# Patient Record
Sex: Male | Born: 1940 | Race: Black or African American | Hispanic: No | Marital: Married | Smoking: Former smoker
Health system: Southern US, Community
[De-identification: ages and names within clinical notes are randomized; demographics above are authoritative.]

## PROBLEM LIST (undated history)

## (undated) DIAGNOSIS — G4733 Obstructive sleep apnea (adult) (pediatric): Secondary | ICD-10-CM

## (undated) DIAGNOSIS — E7800 Pure hypercholesterolemia, unspecified: Secondary | ICD-10-CM

## (undated) DIAGNOSIS — I10 Essential (primary) hypertension: Secondary | ICD-10-CM

## (undated) DIAGNOSIS — I2510 Atherosclerotic heart disease of native coronary artery without angina pectoris: Secondary | ICD-10-CM

## (undated) DIAGNOSIS — K219 Gastro-esophageal reflux disease without esophagitis: Secondary | ICD-10-CM

## (undated) DIAGNOSIS — I4891 Unspecified atrial fibrillation: Secondary | ICD-10-CM

## (undated) DIAGNOSIS — J449 Chronic obstructive pulmonary disease, unspecified: Secondary | ICD-10-CM

## (undated) DIAGNOSIS — E119 Type 2 diabetes mellitus without complications: Secondary | ICD-10-CM

## (undated) DIAGNOSIS — M1990 Unspecified osteoarthritis, unspecified site: Secondary | ICD-10-CM

## (undated) HISTORY — DX: Chronic obstructive pulmonary disease, unspecified: J44.9

## (undated) HISTORY — DX: Type 2 diabetes mellitus without complications: E11.9

## (undated) HISTORY — PX: COLON RESECTION: SHX5231

## (undated) HISTORY — DX: Pure hypercholesterolemia, unspecified: E78.00

## (undated) HISTORY — DX: Unspecified atrial fibrillation: I48.91

## (undated) HISTORY — DX: Unspecified osteoarthritis, unspecified site: M19.90

## (undated) HISTORY — DX: Essential (primary) hypertension: I10

## (undated) HISTORY — DX: Obstructive sleep apnea (adult) (pediatric): G47.33

## (undated) HISTORY — DX: Atherosclerotic heart disease of native coronary artery without angina pectoris: I25.10

## (undated) HISTORY — PX: APPENDECTOMY: SHX54

## (undated) HISTORY — PX: ABDOMINAL SURGERY: SHX537

## (undated) HISTORY — DX: Gastro-esophageal reflux disease without esophagitis: K21.9

## (undated) HISTORY — PX: OTHER SURGICAL HISTORY: SHX169

---

## 2001-01-08 ENCOUNTER — Ambulatory Visit (HOSPITAL_COMMUNITY): Admission: RE | Admit: 2001-01-08 | Discharge: 2001-01-08 | Payer: Self-pay | Admitting: Pulmonary Disease

## 2001-01-17 ENCOUNTER — Encounter: Payer: Self-pay | Admitting: Emergency Medicine

## 2001-01-17 ENCOUNTER — Emergency Department (HOSPITAL_COMMUNITY): Admission: EM | Admit: 2001-01-17 | Discharge: 2001-01-17 | Payer: Self-pay | Admitting: Emergency Medicine

## 2001-04-17 ENCOUNTER — Ambulatory Visit (HOSPITAL_COMMUNITY): Admission: RE | Admit: 2001-04-17 | Discharge: 2001-04-17 | Payer: Self-pay | Admitting: Family Medicine

## 2001-04-17 ENCOUNTER — Encounter: Payer: Self-pay | Admitting: Family Medicine

## 2002-12-31 ENCOUNTER — Emergency Department (HOSPITAL_COMMUNITY): Admission: EM | Admit: 2002-12-31 | Discharge: 2002-12-31 | Payer: Self-pay | Admitting: *Deleted

## 2002-12-31 ENCOUNTER — Encounter: Payer: Self-pay | Admitting: Internal Medicine

## 2002-12-31 ENCOUNTER — Inpatient Hospital Stay (HOSPITAL_COMMUNITY): Admission: EM | Admit: 2002-12-31 | Discharge: 2003-01-03 | Payer: Self-pay | Admitting: Cardiology

## 2002-12-31 ENCOUNTER — Encounter: Payer: Self-pay | Admitting: *Deleted

## 2003-01-02 ENCOUNTER — Encounter: Payer: Self-pay | Admitting: *Deleted

## 2003-07-25 ENCOUNTER — Ambulatory Visit (HOSPITAL_COMMUNITY): Admission: RE | Admit: 2003-07-25 | Discharge: 2003-07-25 | Payer: Self-pay | Admitting: Gastroenterology

## 2004-04-23 ENCOUNTER — Ambulatory Visit: Payer: Self-pay | Admitting: *Deleted

## 2004-05-02 ENCOUNTER — Ambulatory Visit: Payer: Self-pay | Admitting: Critical Care Medicine

## 2004-05-14 ENCOUNTER — Encounter: Payer: Self-pay | Admitting: Orthopedic Surgery

## 2004-06-12 ENCOUNTER — Ambulatory Visit: Payer: Self-pay | Admitting: Internal Medicine

## 2004-06-27 ENCOUNTER — Ambulatory Visit: Payer: Self-pay | Admitting: *Deleted

## 2004-07-03 ENCOUNTER — Ambulatory Visit: Payer: Self-pay | Admitting: Critical Care Medicine

## 2004-07-11 ENCOUNTER — Ambulatory Visit: Payer: Self-pay | Admitting: Cardiology

## 2004-08-01 ENCOUNTER — Ambulatory Visit: Payer: Self-pay | Admitting: Cardiology

## 2004-08-25 ENCOUNTER — Emergency Department (HOSPITAL_COMMUNITY): Admission: EM | Admit: 2004-08-25 | Discharge: 2004-08-26 | Payer: Self-pay | Admitting: Emergency Medicine

## 2004-09-03 ENCOUNTER — Ambulatory Visit: Payer: Self-pay | Admitting: Cardiology

## 2004-10-19 ENCOUNTER — Ambulatory Visit: Payer: Self-pay | Admitting: Cardiology

## 2004-10-19 ENCOUNTER — Ambulatory Visit: Payer: Self-pay | Admitting: Critical Care Medicine

## 2004-11-16 ENCOUNTER — Ambulatory Visit: Payer: Self-pay | Admitting: Cardiology

## 2004-12-06 ENCOUNTER — Ambulatory Visit: Payer: Self-pay | Admitting: Internal Medicine

## 2004-12-06 ENCOUNTER — Ambulatory Visit: Payer: Self-pay | Admitting: *Deleted

## 2004-12-20 ENCOUNTER — Ambulatory Visit: Payer: Self-pay | Admitting: Critical Care Medicine

## 2005-01-16 ENCOUNTER — Ambulatory Visit: Payer: Self-pay | Admitting: *Deleted

## 2005-02-12 ENCOUNTER — Ambulatory Visit (HOSPITAL_COMMUNITY): Admission: RE | Admit: 2005-02-12 | Discharge: 2005-02-12 | Payer: Self-pay | Admitting: Nephrology

## 2005-02-14 ENCOUNTER — Ambulatory Visit: Payer: Self-pay | Admitting: *Deleted

## 2005-03-14 ENCOUNTER — Ambulatory Visit: Payer: Self-pay | Admitting: *Deleted

## 2005-04-11 ENCOUNTER — Ambulatory Visit: Payer: Self-pay | Admitting: *Deleted

## 2005-04-12 ENCOUNTER — Ambulatory Visit: Payer: Self-pay | Admitting: Critical Care Medicine

## 2005-05-13 ENCOUNTER — Ambulatory Visit: Payer: Self-pay | Admitting: Orthopedic Surgery

## 2005-06-17 ENCOUNTER — Ambulatory Visit: Payer: Self-pay | Admitting: Critical Care Medicine

## 2005-06-19 ENCOUNTER — Ambulatory Visit: Payer: Self-pay | Admitting: *Deleted

## 2005-08-12 ENCOUNTER — Ambulatory Visit: Payer: Self-pay | Admitting: Cardiology

## 2005-09-17 ENCOUNTER — Ambulatory Visit: Payer: Self-pay | Admitting: *Deleted

## 2005-09-18 ENCOUNTER — Ambulatory Visit: Payer: Self-pay | Admitting: Critical Care Medicine

## 2005-10-18 ENCOUNTER — Ambulatory Visit: Payer: Self-pay | Admitting: *Deleted

## 2005-11-21 ENCOUNTER — Ambulatory Visit: Payer: Self-pay | Admitting: *Deleted

## 2005-12-20 ENCOUNTER — Ambulatory Visit: Payer: Self-pay | Admitting: Cardiology

## 2005-12-23 ENCOUNTER — Ambulatory Visit: Payer: Self-pay | Admitting: Cardiology

## 2005-12-30 ENCOUNTER — Ambulatory Visit: Payer: Self-pay | Admitting: Cardiology

## 2006-02-04 ENCOUNTER — Ambulatory Visit: Payer: Self-pay | Admitting: *Deleted

## 2006-02-06 ENCOUNTER — Ambulatory Visit: Payer: Self-pay | Admitting: Critical Care Medicine

## 2006-02-26 ENCOUNTER — Ambulatory Visit: Payer: Self-pay | Admitting: Critical Care Medicine

## 2006-03-11 ENCOUNTER — Ambulatory Visit: Payer: Self-pay | Admitting: Cardiology

## 2006-03-31 ENCOUNTER — Ambulatory Visit: Payer: Self-pay | Admitting: Orthopedic Surgery

## 2006-04-14 ENCOUNTER — Ambulatory Visit: Payer: Self-pay | Admitting: Orthopedic Surgery

## 2006-04-15 ENCOUNTER — Ambulatory Visit: Payer: Self-pay | Admitting: Cardiology

## 2006-05-06 ENCOUNTER — Ambulatory Visit: Payer: Self-pay | Admitting: Cardiovascular Disease

## 2006-05-08 ENCOUNTER — Ambulatory Visit: Payer: Self-pay | Admitting: Critical Care Medicine

## 2006-05-18 ENCOUNTER — Ambulatory Visit: Payer: Self-pay | Admitting: Cardiology

## 2006-05-18 ENCOUNTER — Inpatient Hospital Stay (HOSPITAL_COMMUNITY): Admission: EM | Admit: 2006-05-18 | Discharge: 2006-05-19 | Payer: Self-pay | Admitting: Emergency Medicine

## 2006-05-26 ENCOUNTER — Ambulatory Visit: Payer: Self-pay | Admitting: Cardiology

## 2006-05-26 ENCOUNTER — Encounter (HOSPITAL_COMMUNITY): Admission: RE | Admit: 2006-05-26 | Discharge: 2006-06-09 | Payer: Self-pay | Admitting: Cardiology

## 2006-05-26 ENCOUNTER — Ambulatory Visit: Payer: Self-pay | Admitting: Orthopedic Surgery

## 2006-05-26 ENCOUNTER — Ambulatory Visit: Payer: Self-pay | Admitting: Internal Medicine

## 2006-06-05 ENCOUNTER — Ambulatory Visit: Payer: Self-pay | Admitting: Cardiology

## 2006-06-09 ENCOUNTER — Ambulatory Visit: Payer: Self-pay | Admitting: Internal Medicine

## 2006-07-10 ENCOUNTER — Ambulatory Visit: Payer: Self-pay | Admitting: Cardiology

## 2006-07-17 ENCOUNTER — Ambulatory Visit: Payer: Self-pay | Admitting: Internal Medicine

## 2006-07-21 ENCOUNTER — Ambulatory Visit: Payer: Self-pay | Admitting: Critical Care Medicine

## 2006-07-31 ENCOUNTER — Ambulatory Visit: Payer: Self-pay | Admitting: Internal Medicine

## 2006-08-07 ENCOUNTER — Ambulatory Visit: Payer: Self-pay | Admitting: Cardiology

## 2006-09-01 ENCOUNTER — Ambulatory Visit: Payer: Self-pay | Admitting: Critical Care Medicine

## 2006-09-02 ENCOUNTER — Ambulatory Visit: Payer: Self-pay | Admitting: *Deleted

## 2006-09-16 ENCOUNTER — Ambulatory Visit: Payer: Self-pay | Admitting: Cardiovascular Disease

## 2006-10-01 ENCOUNTER — Ambulatory Visit: Payer: Self-pay | Admitting: Cardiology

## 2006-10-30 ENCOUNTER — Ambulatory Visit: Payer: Self-pay | Admitting: Cardiology

## 2006-11-02 ENCOUNTER — Emergency Department (HOSPITAL_COMMUNITY): Admission: EM | Admit: 2006-11-02 | Discharge: 2006-11-02 | Payer: Self-pay | Admitting: Emergency Medicine

## 2006-11-11 ENCOUNTER — Ambulatory Visit: Payer: Self-pay | Admitting: Critical Care Medicine

## 2006-11-26 ENCOUNTER — Ambulatory Visit: Payer: Self-pay | Admitting: Orthopedic Surgery

## 2006-12-04 ENCOUNTER — Ambulatory Visit: Payer: Self-pay | Admitting: Internal Medicine

## 2007-01-06 ENCOUNTER — Ambulatory Visit: Payer: Self-pay | Admitting: Cardiology

## 2007-02-03 ENCOUNTER — Ambulatory Visit: Payer: Self-pay | Admitting: Cardiology

## 2007-02-13 ENCOUNTER — Ambulatory Visit: Payer: Self-pay | Admitting: Internal Medicine

## 2007-02-16 ENCOUNTER — Ambulatory Visit (HOSPITAL_COMMUNITY): Admission: RE | Admit: 2007-02-16 | Discharge: 2007-02-17 | Payer: Self-pay | Admitting: Internal Medicine

## 2007-02-19 ENCOUNTER — Ambulatory Visit: Payer: Self-pay | Admitting: Cardiology

## 2007-02-26 ENCOUNTER — Ambulatory Visit: Payer: Self-pay | Admitting: Orthopedic Surgery

## 2007-03-16 ENCOUNTER — Ambulatory Visit: Payer: Self-pay | Admitting: Critical Care Medicine

## 2007-03-18 DIAGNOSIS — I4819 Other persistent atrial fibrillation: Secondary | ICD-10-CM | POA: Insufficient documentation

## 2007-03-18 DIAGNOSIS — I4891 Unspecified atrial fibrillation: Secondary | ICD-10-CM

## 2007-03-18 DIAGNOSIS — I110 Hypertensive heart disease with heart failure: Secondary | ICD-10-CM | POA: Insufficient documentation

## 2007-03-18 DIAGNOSIS — K219 Gastro-esophageal reflux disease without esophagitis: Secondary | ICD-10-CM | POA: Insufficient documentation

## 2007-03-18 DIAGNOSIS — I2510 Atherosclerotic heart disease of native coronary artery without angina pectoris: Secondary | ICD-10-CM | POA: Insufficient documentation

## 2007-03-18 DIAGNOSIS — I509 Heart failure, unspecified: Secondary | ICD-10-CM

## 2007-03-18 DIAGNOSIS — J449 Chronic obstructive pulmonary disease, unspecified: Secondary | ICD-10-CM | POA: Insufficient documentation

## 2007-03-30 ENCOUNTER — Ambulatory Visit: Payer: Self-pay | Admitting: Orthopedic Surgery

## 2007-03-30 DIAGNOSIS — M19049 Primary osteoarthritis, unspecified hand: Secondary | ICD-10-CM | POA: Insufficient documentation

## 2007-05-14 ENCOUNTER — Ambulatory Visit: Payer: Self-pay | Admitting: Orthopedic Surgery

## 2007-05-14 DIAGNOSIS — G5600 Carpal tunnel syndrome, unspecified upper limb: Secondary | ICD-10-CM

## 2007-07-07 ENCOUNTER — Ambulatory Visit: Payer: Self-pay | Admitting: Critical Care Medicine

## 2007-07-20 ENCOUNTER — Ambulatory Visit: Payer: Self-pay | Admitting: Internal Medicine

## 2007-08-17 ENCOUNTER — Ambulatory Visit: Payer: Self-pay | Admitting: Orthopedic Surgery

## 2007-09-07 ENCOUNTER — Encounter: Payer: Self-pay | Admitting: Orthopedic Surgery

## 2007-10-01 ENCOUNTER — Ambulatory Visit: Payer: Self-pay | Admitting: Orthopedic Surgery

## 2007-10-01 DIAGNOSIS — M5020 Other cervical disc displacement, unspecified cervical region: Secondary | ICD-10-CM | POA: Insufficient documentation

## 2007-10-01 DIAGNOSIS — M5412 Radiculopathy, cervical region: Secondary | ICD-10-CM | POA: Insufficient documentation

## 2007-10-07 ENCOUNTER — Telehealth: Payer: Self-pay | Admitting: Orthopedic Surgery

## 2007-10-12 ENCOUNTER — Ambulatory Visit (HOSPITAL_COMMUNITY): Admission: RE | Admit: 2007-10-12 | Discharge: 2007-10-12 | Payer: Self-pay | Admitting: Orthopedic Surgery

## 2007-11-17 ENCOUNTER — Encounter: Payer: Self-pay | Admitting: Critical Care Medicine

## 2007-12-07 ENCOUNTER — Ambulatory Visit: Payer: Self-pay | Admitting: Orthopedic Surgery

## 2007-12-07 DIAGNOSIS — M7150 Other bursitis, not elsewhere classified, unspecified site: Secondary | ICD-10-CM

## 2007-12-18 ENCOUNTER — Ambulatory Visit: Payer: Self-pay | Admitting: Orthopedic Surgery

## 2007-12-18 ENCOUNTER — Ambulatory Visit (HOSPITAL_COMMUNITY): Admission: RE | Admit: 2007-12-18 | Discharge: 2007-12-18 | Payer: Self-pay | Admitting: Orthopedic Surgery

## 2007-12-22 ENCOUNTER — Ambulatory Visit: Payer: Self-pay | Admitting: Orthopedic Surgery

## 2007-12-22 DIAGNOSIS — M25579 Pain in unspecified ankle and joints of unspecified foot: Secondary | ICD-10-CM

## 2007-12-29 ENCOUNTER — Ambulatory Visit: Payer: Self-pay | Admitting: Orthopedic Surgery

## 2008-01-08 ENCOUNTER — Ambulatory Visit (HOSPITAL_COMMUNITY): Admission: RE | Admit: 2008-01-08 | Discharge: 2008-01-08 | Payer: Self-pay | Admitting: Orthopedic Surgery

## 2008-01-08 ENCOUNTER — Ambulatory Visit: Payer: Self-pay | Admitting: Orthopedic Surgery

## 2008-01-18 ENCOUNTER — Ambulatory Visit: Payer: Self-pay | Admitting: Orthopedic Surgery

## 2008-01-21 ENCOUNTER — Ambulatory Visit: Payer: Self-pay | Admitting: Orthopedic Surgery

## 2008-01-25 ENCOUNTER — Encounter: Payer: Self-pay | Admitting: Orthopedic Surgery

## 2008-02-25 ENCOUNTER — Ambulatory Visit: Payer: Self-pay | Admitting: Orthopedic Surgery

## 2008-03-28 ENCOUNTER — Ambulatory Visit: Payer: Self-pay | Admitting: Orthopedic Surgery

## 2008-05-16 ENCOUNTER — Ambulatory Visit: Payer: Self-pay | Admitting: Orthopedic Surgery

## 2008-05-17 ENCOUNTER — Telehealth: Payer: Self-pay | Admitting: Orthopedic Surgery

## 2008-05-17 ENCOUNTER — Encounter: Payer: Self-pay | Admitting: Orthopedic Surgery

## 2008-05-25 ENCOUNTER — Ambulatory Visit: Payer: Self-pay | Admitting: Cardiology

## 2008-06-22 ENCOUNTER — Encounter (INDEPENDENT_AMBULATORY_CARE_PROVIDER_SITE_OTHER): Payer: Self-pay | Admitting: *Deleted

## 2008-07-11 ENCOUNTER — Encounter: Payer: Self-pay | Admitting: Orthopedic Surgery

## 2008-08-01 ENCOUNTER — Ambulatory Visit (HOSPITAL_COMMUNITY): Admission: RE | Admit: 2008-08-01 | Discharge: 2008-08-01 | Payer: Self-pay | Admitting: Urology

## 2008-08-10 ENCOUNTER — Ambulatory Visit: Payer: Self-pay | Admitting: Orthopedic Surgery

## 2008-08-10 DIAGNOSIS — M19049 Primary osteoarthritis, unspecified hand: Secondary | ICD-10-CM | POA: Insufficient documentation

## 2008-08-24 ENCOUNTER — Encounter (INDEPENDENT_AMBULATORY_CARE_PROVIDER_SITE_OTHER): Payer: Self-pay | Admitting: *Deleted

## 2008-08-31 ENCOUNTER — Ambulatory Visit: Payer: Self-pay | Admitting: Orthopedic Surgery

## 2008-09-05 ENCOUNTER — Ambulatory Visit: Payer: Self-pay | Admitting: Cardiology

## 2008-09-26 ENCOUNTER — Ambulatory Visit: Payer: Self-pay | Admitting: Critical Care Medicine

## 2008-10-10 ENCOUNTER — Encounter: Payer: Self-pay | Admitting: Critical Care Medicine

## 2008-10-10 ENCOUNTER — Ambulatory Visit: Payer: Self-pay | Admitting: Critical Care Medicine

## 2008-10-10 ENCOUNTER — Ambulatory Visit (HOSPITAL_BASED_OUTPATIENT_CLINIC_OR_DEPARTMENT_OTHER): Admission: RE | Admit: 2008-10-10 | Discharge: 2008-10-10 | Payer: Self-pay | Admitting: Critical Care Medicine

## 2008-10-14 ENCOUNTER — Encounter: Payer: Self-pay | Admitting: Critical Care Medicine

## 2008-10-14 ENCOUNTER — Ambulatory Visit: Payer: Self-pay | Admitting: Pulmonary Disease

## 2008-10-18 ENCOUNTER — Encounter: Payer: Self-pay | Admitting: Critical Care Medicine

## 2008-10-25 ENCOUNTER — Ambulatory Visit (HOSPITAL_COMMUNITY): Admission: RE | Admit: 2008-10-25 | Discharge: 2008-10-26 | Payer: Self-pay | Admitting: Orthopedic Surgery

## 2008-11-03 ENCOUNTER — Encounter: Payer: Self-pay | Admitting: Critical Care Medicine

## 2008-11-04 ENCOUNTER — Ambulatory Visit: Payer: Self-pay | Admitting: Pulmonary Disease

## 2008-11-04 DIAGNOSIS — G4710 Hypersomnia, unspecified: Secondary | ICD-10-CM | POA: Insufficient documentation

## 2008-11-04 DIAGNOSIS — G4730 Sleep apnea, unspecified: Secondary | ICD-10-CM

## 2008-11-09 ENCOUNTER — Encounter (INDEPENDENT_AMBULATORY_CARE_PROVIDER_SITE_OTHER): Payer: Self-pay | Admitting: *Deleted

## 2008-11-09 ENCOUNTER — Telehealth: Payer: Self-pay | Admitting: Orthopedic Surgery

## 2008-12-06 ENCOUNTER — Encounter: Payer: Self-pay | Admitting: Pulmonary Disease

## 2008-12-07 ENCOUNTER — Ambulatory Visit: Payer: Self-pay | Admitting: Pulmonary Disease

## 2009-01-24 ENCOUNTER — Ambulatory Visit: Payer: Self-pay | Admitting: Critical Care Medicine

## 2009-01-27 ENCOUNTER — Encounter: Payer: Self-pay | Admitting: Critical Care Medicine

## 2009-02-05 ENCOUNTER — Encounter: Payer: Self-pay | Admitting: Pulmonary Disease

## 2009-02-06 ENCOUNTER — Ambulatory Visit: Payer: Self-pay | Admitting: Pulmonary Disease

## 2009-02-20 ENCOUNTER — Ambulatory Visit (HOSPITAL_COMMUNITY): Admission: RE | Admit: 2009-02-20 | Discharge: 2009-02-20 | Payer: Self-pay | Admitting: Family Medicine

## 2009-04-25 ENCOUNTER — Encounter: Payer: Self-pay | Admitting: Orthopedic Surgery

## 2009-04-25 ENCOUNTER — Ambulatory Visit (HOSPITAL_COMMUNITY): Admission: RE | Admit: 2009-04-25 | Discharge: 2009-04-25 | Payer: Self-pay | Admitting: Family Medicine

## 2009-05-07 ENCOUNTER — Encounter: Payer: Self-pay | Admitting: Pulmonary Disease

## 2009-05-15 ENCOUNTER — Ambulatory Visit: Payer: Self-pay | Admitting: Pulmonary Disease

## 2009-05-16 ENCOUNTER — Telehealth: Payer: Self-pay | Admitting: Pulmonary Disease

## 2009-05-18 ENCOUNTER — Ambulatory Visit: Payer: Self-pay | Admitting: Orthopedic Surgery

## 2009-05-18 DIAGNOSIS — M19079 Primary osteoarthritis, unspecified ankle and foot: Secondary | ICD-10-CM | POA: Insufficient documentation

## 2009-05-24 ENCOUNTER — Telehealth: Payer: Self-pay | Admitting: Critical Care Medicine

## 2009-06-11 ENCOUNTER — Encounter: Payer: Self-pay | Admitting: Pulmonary Disease

## 2009-06-29 ENCOUNTER — Ambulatory Visit: Payer: Self-pay | Admitting: Orthopedic Surgery

## 2009-08-30 ENCOUNTER — Ambulatory Visit: Payer: Self-pay | Admitting: Orthopedic Surgery

## 2009-09-11 ENCOUNTER — Encounter: Payer: Self-pay | Admitting: Pulmonary Disease

## 2009-09-13 ENCOUNTER — Ambulatory Visit: Payer: Self-pay | Admitting: Pulmonary Disease

## 2009-09-14 ENCOUNTER — Telehealth (INDEPENDENT_AMBULATORY_CARE_PROVIDER_SITE_OTHER): Payer: Self-pay | Admitting: *Deleted

## 2009-09-22 ENCOUNTER — Encounter: Payer: Self-pay | Admitting: Pulmonary Disease

## 2009-10-13 ENCOUNTER — Ambulatory Visit: Payer: Self-pay | Admitting: Critical Care Medicine

## 2010-01-01 ENCOUNTER — Encounter: Payer: Self-pay | Admitting: Pulmonary Disease

## 2010-01-11 ENCOUNTER — Ambulatory Visit: Payer: Self-pay | Admitting: Pulmonary Disease

## 2010-02-13 ENCOUNTER — Ambulatory Visit: Payer: Self-pay | Admitting: Critical Care Medicine

## 2010-02-13 DIAGNOSIS — E119 Type 2 diabetes mellitus without complications: Secondary | ICD-10-CM | POA: Insufficient documentation

## 2010-07-12 NOTE — Progress Notes (Signed)
Summary: c pap setting clarrification  Phone Note Call from Patient   Caller: Spouse cynthia Call For: alva Summary of Call: have questions about c pap setting Initial call taken by: Rickard Patience,  September 14, 2009 4:28 PM  Follow-up for Phone Call        called, spoke with pt's wife Aram Beecham.  Per Aram Beecham, they were told by RA yesterday at ov that cpap setting would not be changed.  However, they received a call from [facility] today wanting to come out to change cpap settings.  There is an order for this in EMR.  Cynthia aware RA will not be in office until tomorrow - she is ok with this.  Will forward to RA - Dr. Vassie Loll, did you want the settings to be changed?  Pls advise.  Thanks!  Follow-up by: Gweneth Dimitri RN,  September 14, 2009 4:34 PM  Additional Follow-up for Phone Call Additional follow up Details #1::        since wife had reported snoring & excessive daytime somnolence on CPAP 14, changed to auto 14-19 as I had discussed with them. She must have misunderstood me. He should not feel the change Additional Follow-up by: Comer Locket. Vassie Loll MD,  September 15, 2009 10:41 AM    Additional Follow-up for Phone Call Additional follow up Details #2::    called, spoke with pt's wife Aram Beecham.  Informed her of above statement per RA - she verbalized understanding, is ok with this, and verbalized no further questions.  Follow-up by: Gweneth Dimitri RN,  September 15, 2009 11:04 AM

## 2010-07-12 NOTE — Assessment & Plan Note (Signed)
Summary: 4 months/sleep f/u/apc   Visit Type:  Follow-up Primary Provider/Referring Provider:  Foster Simpson  CC:  Pt here for follow up. Pt states is using CPAP every night approx 6 hours .  History of Present Illness: 70/M  with chronic obstructive lung disease & severe obstructive sleep apnea .    Oct 13, 2009 3:27 PM Since last pulm ov 8/10 pt more dyspneic with more prod cough of thick yellow for 1.5 months SOme trace hemoptysis.  No recent cxr,  not tolerating cpap as well No prednisone or ABX in the past 2-3 weeks  January 11, 2010 9:17 AM  Download on 14 cm 6/10-7/17/11 shows good compliance, avg AHI 11/h, no excessive daytime somnolence, no snoring wt 263 unchanged from CPAP study 5/10    Preventive Screening-Counseling & Management  Alcohol-Tobacco     Smoking Status: quit > 6 months     Packs/Day: 1.5     Year Started: 1957     Year Quit: 1988  Current Medications (verified): 1)  Crestor 40 Mg Tabs (Rosuvastatin Calcium) .Marland Kitchen.. 1 By Mouth Daily 2)  Diltiazem Hcl Er Beads 240 Mg Xr24h-Cap (Diltiazem Hcl Er Beads) .... Take 1 Tablet By Mouth Once A Day 3)  Singulair 10 Mg Tabs (Montelukast Sodium) .Marland Kitchen.. 1 By Mouth Daily 4)  Protonix 40 Mg Tbec (Pantoprazole Sodium) .Marland Kitchen.. 1 By Mouth Two Times A Day 5)  Diovan 320 Mg Tabs (Valsartan) .Marland Kitchen.. 1 By Mouth Daily 6)  Furosemide 40 Mg Tabs (Furosemide) .... 2 By Mouth Every Other Day 7)  Proair Hfa 108 (90 Base) Mcg/act  Aers (Albuterol Sulfate) .Marland Kitchen.. 1-2 Puffs Every 4-6 Hours As Needed 8)  Flovent Hfa 220 Mcg/act  Aero (Fluticasone Propionate  Hfa) .... Two Puff Two Times A Day 9)  Bayer Aspirin 325 Mg  Tabs (Aspirin) .Marland Kitchen.. 1 Podaily 10)  Diclofenac Sodium 75 Mg Tbec (Diclofenac Sodium) .... One By Mouth Bid 11)  Klor-Con M20 20 Meq Cr-Tabs (Potassium Chloride Crys Cr) .... Take 1 Tab Two Times A Day 12)  Zetia 10 Mg Tabs (Ezetimibe) .Marland Kitchen.. 1 By Mouth Daily 13)  Colchicine 0.6 Mg Tabs (Colchicine) .Marland Kitchen.. 1 By Mouth Every Other Day 14)   Foradil Aerolizer 12 Mcg Caps (Formoterol Fumarate) .Marland Kitchen.. 1 Capsule Two Times A Day in Inhaler 15)  Cpap + 19 Cm, Full Face Mask .... Ahc 16)  Mucinex Dm Maximum Strength 60-1200 Mg Xr12h-Tab (Dextromethorphan-Guaifenesin) .... Take 1 Tablet By Mouth Two Times A Day 17)  Albuterol Sulfate (2.5 Mg/74ml) 0.083% Nebu (Albuterol Sulfate) .... Three Times A Day 18)  Ipratropium Bromide 0.03 % Soln (Ipratropium Bromide) .... Three Times A Day 19)  Nitrostat 0.4 Mg Subl (Nitroglycerin) .... As Directed As Needed 20)  Insulin .Marland Kitchen.. 42 Units  Every Morning  Allergies (verified): No Known Drug Allergies  Past History:  Social History: Last updated: 11/04/2008 Patient states former smoker.  Employed as Statistician married and lives with wife children 1 dog  Past Medical History: htn asthma gout arthritis   ?type  ?RA  elevated chelesterol acid reflux COPD Coronary artery disease GERD Atrial fibrillation OSA   -AHI 130 10/2008   -CPAP 19  dm  Past Pulmonary History:  Pulmonary History: Severe OSA     Sleep study AHI 130 10/2008  5/10 >> reviewed PSG >> severe obstructive sleep apnea AHI 130/h , lowest destauration 50%,  corrected by CPAP 19 cm Started CPAP 15 cm with medium full face quattro mask + heated humidity Reviewed  download 6/3- 6/29 >> excellent compliance , few residual events AHI 9/h. reviewed compliance 6/5- 8/29 >> good compliance on 19 cm, acceptable residual AHI reviewed 10/21- 11/27 >> good compliance, acceptable residual AHI pressure too high, changed to Fisher Paykel FF mask Download on 14 cm 3/5- 4/4 >> good compliance, residual AHI 10/h  Social History: Packs/Day:  1.5  Review of Systems  The patient denies anorexia, fever, weight loss, weight gain, vision loss, decreased hearing, hoarseness, chest pain, syncope, dyspnea on exertion, peripheral edema, prolonged cough, headaches, hemoptysis, abdominal pain, melena, hematochezia, severe indigestion/heartburn,  hematuria, muscle weakness, suspicious skin lesions, difficulty walking, depression, unusual weight change, and abnormal bleeding.    Vital Signs:  Patient profile:   70 year old male Height:      67 inches Weight:      263.38 pounds O2 Sat:      93 % on Room air Temp:     97.3 degrees F oral Pulse rate:   69 / minute BP sitting:   124 / 80  (right arm) Cuff size:   large  Vitals Entered By: Zackery Barefoot CMA (January 11, 2010 9:06 AM)  O2 Flow:  Room air CC: Pt here for follow up. Pt states is using CPAP every night approx 6 hours  Comments Medications reviewed with patient Verified contact number and pharmacy with patient Zackery Barefoot CMA  January 11, 2010 9:07 AM    Physical Exam  Additional Exam:  wt 263 January 11, 2010  Gen. Pleasant, well-nourished, in no distress, normal affect ENT - no lesions, no post nasal drip, class 3 airway Neck: No JVD, no thyromegaly, no carotid bruits Lungs: no use of accessory muscles, no dullness to percussion, distant BS Cardiovascular: Rhythm regular, heart sounds  normal, no murmurs or gallops, no peripheral edema Musculoskeletal: No deformities, no cyanosis or clubbing      Impression & Recommendations:  Problem # 1:  HYPERSOMNIA, ASSOCIATED WITH SLEEP APNEA (ICD-780.53)  ct cpap 14 cm Compliance encouraged, wt loss emphasized, asked to avoid meds with sedative side effects, cautioned against driving when sleepy.   Orders: Est. Patient Level III ([address])  Problem # 2:  COPD (ICD-496) Assessment: Comment Only  Appears improved on albuterol/ atrovent nebs   Medications Added to Medication List This Visit: 1)  Cpap + 14 Cm, Full Face Mask  .... Ahc 2)  Insulin  .Marland Kitchen.. 42 units  every morning  Patient Instructions: 1)  Please schedule a follow-up appointment in 9 months. 2)  Keep using your CPAP  3)  It is set at 14 cm

## 2010-07-12 NOTE — Assessment & Plan Note (Signed)
Summary: Pulmonary OV   Primary Provider/Referring Provider:  Foster Simpson  CC:  Follow up.  Pt states breathing is the same - no better or worse.  Pt does state he is having wheezing and chest tightness and prod cough with yellow mucus x 1 1/2 months.  .  History of Present Illness: 70 year old, African-American male with chronic obstructive lung disease & severe obstructive sleep apnea .   Sleep history significant for loud snoring, witnessed apneas, nocturia, frequent awakenings 5/10 >> reviewed PSG >> severe obstructive sleep apnea AHI 130/h , lowest destauration 50%,  corrected by CPAP 19 cm Started CPAP 15 cm with medium full face quattro mask + heated humidity, increased in 1 week to 19 cm. Reviewed download 6/3- 6/29 >> excellent compliance , few residual events AHI 9/h.  February 06, 2009 10:00 AM  Sleeping longer, not many awakenings, memory has improved.c/o dryness c/o Mask leak, Chcecked fit in office >> good seal, , pressure OK, Sleeping better, more energy, less sleepy per wife, no snoring. Falls asleep in church sometimes.  May 15, 2009 11:07 AM  reviewed compliance 6/5- 8/29 >> good compliance on 19 cm, acceptable residual AHI reviewed 10/21- 11/27 >> good compliance, acceptable residual AHI pressure too high, changed to Fisher Paykel FF mask  September 13, 2009 1:41 PM  c/o waking up at night when CPAP ramps. Pt states is using machine every night. Pt states has ordered new mask.  Wife reports sleepy in daytime. Download on 14 cm 3/5- 4/4 >> good compliance, residual AHI 10/h c/o cough with dark phlegm, started with URI, cannot afford spiriva - requesting change to nebuliser.  has started ABx - ? azithro  Oct 13, 2009 3:27 PM Since last pulm ov 8/10 pt more dyspneic with more prod cough of thick yellow for 1.5 months SOme trace hemoptysis.  No recent cxr,  not tolerating cpap as well No prednisone or ABX in the past 2-3 weeks    Preventive Screening-Counseling &  Management  Alcohol-Tobacco     Smoking Status: quit > 6 months  Current Medications (verified): 1)  Crestor 40 Mg Tabs (Rosuvastatin Calcium) .Marland Kitchen.. 1 By Mouth Daily 2)  Diltiazem Hcl Cr 240 Mg Xr24h-Cap (Diltiazem Hcl) .Marland Kitchen.. 1 By Mouth Daily 3)  Singulair 10 Mg Tabs (Montelukast Sodium) .Marland Kitchen.. 1 By Mouth Daily 4)  Protonix 40 Mg Tbec (Pantoprazole Sodium) .Marland Kitchen.. 1 By Mouth Two Times A Day 5)  Diovan 320 Mg Tabs (Valsartan) .Marland Kitchen.. 1 By Mouth Daily 6)  Furosemide 40 Mg Tabs (Furosemide) .... 2 By Mouth Every Other Day 7)  Proair Hfa 108 (90 Base) Mcg/act  Aers (Albuterol Sulfate) .Marland Kitchen.. 1-2 Puffs Every 4-6 Hours As Needed 8)  Flovent Hfa 220 Mcg/act  Aero (Fluticasone Propionate  Hfa) .... Two Puff Two Times A Day 9)  Bayer Aspirin 325 Mg  Tabs (Aspirin) .Marland Kitchen.. 1 Podaily 10)  Diclofenac Sodium 75 Mg Tbec (Diclofenac Sodium) .... One By Mouth Bid 11)  Klor-Con M20 20 Meq Cr-Tabs (Potassium Chloride Crys Cr) .... Take 1 Tab Two Times A Day 12)  Zetia 10 Mg Tabs (Ezetimibe) .Marland Kitchen.. 1 By Mouth Daily 13)  Colchicine 0.6 Mg Tabs (Colchicine) .Marland Kitchen.. 1 By Mouth Every Other Day 14)  Foradil Aerolizer 12 Mcg Caps (Formoterol Fumarate) .Marland Kitchen.. 1 Capsule Two Times A Day in Inhaler 15)  Cpap + 19 Cm, Full Face Mask .... Ahc 16)  Mucinex Dm Maximum Strength 60-1200 Mg Xr12h-Tab (Dextromethorphan-Guaifenesin) .... Take 1 Tablet By Mouth Two  Times A Day 17)  Albuterol Sulfate (2.5 Mg/42ml) 0.083% Nebu (Albuterol Sulfate) .... Three Times A Day 18)  Ipratropium Bromide 0.03 % Soln (Ipratropium Bromide) .... Three Times A Day 19)  Allopurinol 300 Mg Tabs (Allopurinol) .... Once Daily 20)  Nitrostat 0.4 Mg Subl (Nitroglycerin) .... As Directed As Needed  Allergies (verified): No Known Drug Allergies  Past History:  Past medical, surgical, family and social histories (including risk factors) reviewed, and no changes noted (except as noted below).  Past Medical History: Reviewed history from 01/24/2009 and no changes  required. htn asthma gout arthritis   ?type  ?RA  elevated chelesterol acid reflux COPD Coronary artery disease GERD Atrial fibrillation OSA   -AHI 130 10/2008   -CPAP 19   Past Surgical History: appendectomy  RIGHT carpal tunnel release/LEFT carpal tunnel release  fluid of abdomen, removal of cyst 2010.  Past Pulmonary History:  Pulmonary History: COPD: Pulmonary Function Test  Date: 10/10/2008 Height (in.): 67 Gender: Male  Pre-Spirometry  FVC     Value: 3.21 L/min   Pred: 4.00 L/min     % Pred: 80 % FEV1     Value: 2.47 L     Pred: 2.76 L     % Pred: 90 % FEV1/FVC   Value: 77 %     Pred: 70 %     FEF 25-75   Value: 2.28 L/min   Pred: 2.65 L/min     % Pred: 86 %  Post-Spirometry  FVC     Value: 3.20 L/min   Pred: 4.00 L/min     % Pred: 80 % FEV1     Value: 2.41 L     Pred: 2.76 L     % Pred: 87 % FEV1/FVC   Value: 75 %     Pred: 70 %     FEF 25-75   Value: 1.74 L/min   Pred: 2.65 L/min     % Pred: 66 %  Lung Volumes  TLC     Value: 4.67 L   % Pred: 80 % RV     Value: 1.22 L   % Pred: 53 % DLCO     Value: 18.8 %   % Pred: 74 % DLCO/VA   Value: 4.27 %   % Pred: 120 %   Severe OSA     Sleep study AHI 130 10/2008  Family History: Reviewed history from 11/04/2008 and no changes required. MI/Heart Attack  brother emphysema- uncle asthma- self  Social History: Reviewed history from 11/04/2008 and no changes required. Patient states former smoker.  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  The patient denies coughing up blood, chest pain, irregular heartbeats, acid heartburn, indigestion, loss of appetite, weight change, abdominal pain, difficulty swallowing, sore throat, tooth/dental problems, headaches, nasal congestion/difficulty breathing through nose, sneezing, itching, ear  ache, anxiety, depression, hand/feet swelling, joint stiffness or pain, rash, change in color of mucus, and fever.    Vital Signs:  Patient profile:   70 year old male Height:      67 inches Weight:      276 pounds BMI:     43.38 O2 Sat:      93 % on Room air Temp:     97.6 degrees F oral Pulse rate:   90 /  minute BP sitting:   160 / 96  (left arm) Cuff size:   large  Vitals Entered By: Gweneth Dimitri RN (Oct 13, 2009 3:16 PM)  O2 Flow:  Room air CC: Follow up.  Pt states breathing is the same - no better or worse.  Pt does state he is having wheezing, chest tightness and prod cough with yellow mucus x 1 1/2 months.   Comments Medications reviewed with patient Daytime contact number verified with patient. Gweneth Dimitri RN  Oct 13, 2009 3:16 PM    Physical Exam  Additional Exam:  Gen. Pleasant, well-nourished, in no distress, normal affect ENT - no lesions, no post nasal drip, class 3 airway Neck: No JVD, no thyromegaly, no carotid bruits Lungs: no use of accessory muscles, no dullness to percussion, distant BS Cardiovascular: Rhythm regular, heart sounds  normal, no murmurs or gallops, no peripheral edema Musculoskeletal: No deformities, no cyanosis or clubbing      Impression & Recommendations:  Problem # 1:  ACUTE BRONCHITIS (ICD-466.0) Assessment Deteriorated AB flare with acute bronchtis plan pulse pred avelox for 5 days cont inhaled meds His updated medication list for this problem includes:    Singulair 10 Mg Tabs (Montelukast sodium) .Marland Kitchen... 1 by mouth daily    Proair Hfa 108 (90 Base) Mcg/act Aers (Albuterol sulfate) .Marland Kitchen... 1-2 puffs every 4-6 hours as needed    Flovent Hfa 220 Mcg/act Aero (Fluticasone propionate  hfa) .Marland Kitchen..Marland Kitchen Two puff two times a day    Foradil Aerolizer 12 Mcg Caps (Formoterol fumarate) .Marland Kitchen... 1 capsule two times a day in inhaler    Mucinex Dm Maximum Strength 60-1200 Mg Xr12h-tab (Dextromethorphan-guaifenesin) .Marland Kitchen... Take 1 tablet by mouth two  times a day    Albuterol Sulfate (2.5 Mg/50ml) 0.083% Nebu (Albuterol sulfate) .Marland Kitchen... Three times a day    Avelox 400 Mg Tabs (Moxifloxacin hcl) ..... By mouth daily  Orders: Est. Patient Level IV (16109) Prescription Created Electronically [phone])  Medications Added to Medication List This Visit: 1)  Albuterol Sulfate (2.5 Mg/71ml) 0.083% Nebu (Albuterol sulfate) .... Three times a day 2)  Ipratropium Bromide 0.03 % Soln (Ipratropium bromide) .... Three times a day 3)  Allopurinol 300 Mg Tabs (Allopurinol) .... Once daily 4)  Nitrostat 0.4 Mg Subl (Nitroglycerin) .... As directed as needed 5)  Prednisone 10 Mg Tabs (Prednisone) .... Take as directed 4 each am x3days, 3 x 3days, 2 x 3days, 1 x 3days then stop 6)  Avelox 400 Mg Tabs (Moxifloxacin hcl) .... By mouth daily  Complete Medication List: 1)  Crestor 40 Mg Tabs (Rosuvastatin calcium) .Marland Kitchen.. 1 by mouth daily 2)  Diltiazem Hcl Cr 240 Mg Xr24h-cap (Diltiazem hcl) .Marland Kitchen.. 1 by mouth daily 3)  Singulair 10 Mg Tabs (Montelukast sodium) .Marland Kitchen.. 1 by mouth daily 4)  Protonix 40 Mg Tbec (Pantoprazole sodium) .Marland Kitchen.. 1 by mouth two times a day 5)  Diovan 320 Mg Tabs (Valsartan) .Marland Kitchen.. 1 by mouth daily 6)  Furosemide 40 Mg Tabs (Furosemide) .... 2 by mouth every other day 7)  Proair Hfa 108 (90 Base) Mcg/act Aers (Albuterol sulfate) .Marland Kitchen.. 1-2 puffs every 4-6 hours as needed 8)  Flovent Hfa 220 Mcg/act Aero (Fluticasone propionate  hfa) .... Two puff two times a day 9)  Bayer Aspirin 325 Mg Tabs (Aspirin) .Marland Kitchen.. 1 podaily 10)  Diclofenac Sodium 75 Mg Tbec (Diclofenac sodium) .... One by mouth bid 11)  Klor-con M20 20 Meq Cr-tabs (Potassium chloride crys cr) .... Take 1 tab two times  a day 12)  Zetia 10 Mg Tabs (Ezetimibe) .Marland Kitchen.. 1 by mouth daily 13)  Colchicine 0.6 Mg Tabs (Colchicine) .Marland Kitchen.. 1 by mouth every other day 14)  Foradil Aerolizer 12 Mcg Caps (Formoterol fumarate) .Marland Kitchen.. 1 capsule two times a day in inhaler 15)  Cpap + 19 Cm, Full Face Mask  ....  Ahc 16)  Mucinex Dm Maximum Strength 60-1200 Mg Xr12h-tab (Dextromethorphan-guaifenesin) .... Take 1 tablet by mouth two times a day 17)  Albuterol Sulfate (2.5 Mg/110ml) 0.083% Nebu (Albuterol sulfate) .... Three times a day 18)  Ipratropium Bromide 0.03 % Soln (Ipratropium bromide) .... Three times a day 19)  Allopurinol 300 Mg Tabs (Allopurinol) .... Once daily 20)  Nitrostat 0.4 Mg Subl (Nitroglycerin) .... As directed as needed 21)  Prednisone 10 Mg Tabs (Prednisone) .... Take as directed 4 each am x3days, 3 x 3days, 2 x 3days, 1 x 3days then stop 22)  Avelox 400 Mg Tabs (Moxifloxacin hcl) .... By mouth daily  Patient Instructions: 1)  No change in medications 2)  Prednisone 10mg  4 each am x3days, 3 x 3days, 2 x 3days, 1 x 3days then stop 3)  Avelox for 5 days (samples given) 4)  No other medication changes 5)  Return 4 months, call if unimproved after prednisone and antibiotic Prescriptions: PREDNISONE 10 MG  TABS (PREDNISONE) Take as directed 4 each am x3days, 3 x 3days, 2 x 3days, 1 x 3days then stop  #30 x 0   Entered and Authorized by:   Storm Frisk MD   Signed by:   Storm Frisk MD on 10/13/2009   Method used:   Electronically to        [facility] [phone]* (retail)       [address]       [place], [place]  [address]       Ph: [phone] or [phone]       Fax: [phone]   RxID:   [id number] PROAIR HFA 108 (90 BASE) MCG/ACT  AERS (ALBUTEROL SULFATE) 1-2 puffs every 4-6 hours as needed  #1 x 6   Entered and Authorized by:   Storm Frisk MD   Signed by:   Storm Frisk MD on 10/13/2009   Method used:   [facility] to        [facility] [address] [phone]* (retail)       [address]       [place], [place]  [address]       Ph: [phone] or [phone]       Fax: [phone]   RxID:   [id number]

## 2010-07-12 NOTE — Assessment & Plan Note (Signed)
Summary: 4 months/apc   Visit Type:  Follow-up Primary Provider/Referring Provider:  Foster Simpson  CC:  Pt here for follow up. Pt c/o waking up at night when CPAP ramps. Pt states is using machine every night. Pt states has ordered new mask. Pt states started an antibiotic on Monday but is unsure of name.Marland Kitchen  History of Present Illness: 70 year old, African-American male with chronic obstructive lung disease & severe obstructive sleep apnea .   Sleep history significant for loud snoring, witnessed apneas, nocturia, frequent awakenings 5/10 >> reviewed PSG >> severe obstructive sleep apnea AHI 130/h , lowest destauration 50%,  corrected by CPAP 19 cm Started CPAP 15 cm with medium full face quattro mask + heated humidity, increased in 1 week to 19 cm. Reviewed download 6/3- 6/29 >> excellent compliance , few residual events AHI 9/h.  February 06, 2009 10:00 AM  Sleeping longer, not many awakenings, memory has improved.c/o dryness c/o Mask leak, Chcecked fit in office >> good seal, , pressure OK, Sleeping better, more energy, less sleepy per wife, no snoring. Falls asleep in church sometimes.  May 15, 2009 11:07 AM  reviewed compliance 6/5- 8/29 >> good compliance on 19 cm, acceptable residual AHI reviewed 10/21- 11/27 >> good compliance, acceptable residual AHI pressure too high, changed to Fisher Paykel FF mask  September 13, 2009 1:41 PM  c/o waking up at night when CPAP ramps. Pt states is using machine every night. Pt states has ordered new mask.  Wife reports sleepy in daytime. Download on 14 cm 3/5- 4/4 >> good compliance, residual AHI 10/h c/o cough with dark phlegm, started with URI, cannot afford spiriva - requesting change to nebuliser.  has started ABx - ? azithro  Current Medications (verified): 1)  Crestor 40 Mg Tabs (Rosuvastatin Calcium) .Marland Kitchen.. 1 By Mouth Daily 2)  Diltiazem Hcl Cr 240 Mg Xr24h-Cap (Diltiazem Hcl) .Marland Kitchen.. 1 By Mouth Daily 3)  Singulair 10 Mg Tabs (Montelukast  Sodium) .Marland Kitchen.. 1 By Mouth Daily 4)  Protonix 40 Mg Tbec (Pantoprazole Sodium) .Marland Kitchen.. 1 By Mouth Two Times A Day 5)  Diovan 320 Mg Tabs (Valsartan) .Marland Kitchen.. 1 By Mouth Daily 6)  Furosemide 40 Mg Tabs (Furosemide) .... 2 By Mouth Every Other Day 7)  Proair Hfa 108 (90 Base) Mcg/act  Aers (Albuterol Sulfate) .Marland Kitchen.. 1-2 Puffs Every 4-6 Hours As Needed 8)  Flovent Hfa 220 Mcg/act  Aero (Fluticasone Propionate  Hfa) .... Two Puff Two Times A Day 9)  Spiriva Handihaler 18 Mcg  Caps (Tiotropium Bromide Monohydrate) .... Two Puffs in Handihaler Daily 10)  Bayer Aspirin 325 Mg  Tabs (Aspirin) .Marland Kitchen.. 1 Podaily 11)  Diclofenac Sodium 75 Mg Tbec (Diclofenac Sodium) .... One By Mouth Bid 12)  Klor-Con M20 20 Meq Cr-Tabs (Potassium Chloride Crys Cr) .... Take 1 Tab Two Times A Day 13)  Zetia 10 Mg Tabs (Ezetimibe) .Marland Kitchen.. 1 By Mouth Daily 14)  Colchicine 0.6 Mg Tabs (Colchicine) .Marland Kitchen.. 1 By Mouth Every Other Day 15)  Foradil Aerolizer 12 Mcg Caps (Formoterol Fumarate) .Marland Kitchen.. 1 Capsule Two Times A Day in Inhaler 16)  Albuterol Sulfate (2.5 Mg/44ml) 0.083% Nebu (Albuterol Sulfate) .Marland Kitchen.. 1 Vial in Nebulizer Every 4 Hours As Needed 17)  Cpap + 19 Cm, Full Face Mask .... Ahc 18)  Mucinex Dm Maximum Strength 60-1200 Mg Xr12h-Tab (Dextromethorphan-Guaifenesin) .... Take 1 Tablet By Mouth Two Times A Day  Allergies (verified): No Known Drug Allergies  Past Pulmonary History:  Pulmonary History: COPD: Pulmonary Function Test  Date:  10/10/2008 Height (in.): 67 Gender: Male  Pre-Spirometry  FVC     Value: 3.21 L/min   Pred: 4.00 L/min     % Pred: 80 % FEV1     Value: 2.47 L     Pred: 2.76 L     % Pred: 90 % FEV1/FVC   Value: 77 %     Pred: 70 %     FEF 25-75   Value: 2.28 L/min   Pred: 2.65 L/min     % Pred: 86 %  Post-Spirometry  FVC     Value: 3.20 L/min   Pred: 4.00 L/min     % Pred: 80 % FEV1     Value: 2.41 L     Pred: 2.76 L     % Pred: 87 % FEV1/FVC   Value: 75 %     Pred: 70 %     FEF 25-75   Value: 1.74 L/min   Pred:  2.65 L/min     % Pred: 66 %  Lung Volumes  TLC     Value: 4.67 L   % Pred: 80 % RV     Value: 1.22 L   % Pred: 53 % DLCO     Value: 18.8 %   % Pred: 74 % DLCO/VA   Value: 4.27 %   % Pred: 120 %   Severe OSA     Sleep study AHI 130 10/2008  Vital Signs:  Patient profile:   70 year old male Height:      67 inches Weight:      283.25 pounds O2 Sat:      97 % on Room air Temp:     97.8 degrees F oral Pulse rate:   100 / minute BP sitting:   150 / 94  (left arm) Cuff size:   large  Vitals Entered By: Zackery Barefoot CMA (September 13, 2009 1:24 PM)  O2 Flow:  Room air CC: Pt here for follow up. Pt c/o waking up at night when CPAP ramps. Pt states is using machine every night. Pt states has ordered new mask. Pt states started an antibiotic on Monday but is unsure of name. Comments Medications reviewed with patient Verified contact number and pharmacy with patient Zackery Barefoot CMA  September 13, 2009 1:26 PM     Impression & Recommendations:  Problem # 1:  HYPERSOMNIA, ASSOCIATED WITH SLEEP APNEA (ICD-780.53) Compliance encouraged, wt loss emphasized, asked to avoid meds with sedative side effects, cautioned against driving when sleepy.  His compliance is good. Will change back to auto 14-19 cm & rechk download I doubt he is a candidate for modafinil Orders: Est. Patient Level III (21308) Prescription Created Electronically [phone]) DME Referral (DME)  Problem # 2:  COPD (ICD-496) D/w Dr Daphene Calamity Since he cannot aford spiriva, will start albuterol/ atrovent nebs & have Dr Delford Field evaluate in 1 month  Medications Added to Medication List This Visit: 1)  Albuterol Sulfate (2.5 Mg/56ml) 0.083% Nebu (Albuterol sulfate) .Marland Kitchen.. 1 vial in nebulizer three times a day 2)  Cpap + 19 Cm, Full Face Mask  .... Ahc 3)  Mucinex Dm Maximum Strength 60-1200 Mg Xr12h-tab (Dextromethorphan-guaifenesin) .... Take 1 tablet by mouth two times a day 4)  Ipratropium Bromide 0.03 % Soln (Ipratropium  bromide) .... Three times a day with albuterol  Patient Instructions: 1)  Copy sent to: Dr Megan Mans 2)  Take antibiotic for bronchitis 3)  I will ask Dr Delford Field if you can  change to nebuliser / cheaper medications instead of spiriva - pick up nebuliser from [facility]: IPRATROPIUM BROMIDE 0.03 % SOLN (IPRATROPIUM BROMIDE) three times a day with albuterol  #90 x 1   Entered and Authorized by:   Comer Locket Vassie Loll MD   Signed by:   Comer Locket Vassie Loll MD on 09/13/2009   Method used:   Printed then faxed to ...       [facility] (mail-order)       [address]       [place], [place]  [address]       Ph: [phone]       Fax: [phone]   RxID:   [id number] ALBUTEROL SULFATE (2.5 MG/3ML) 0.083% NEBU (ALBUTEROL SULFATE) 1 vial in nebulizer three times a day  #90 x 1   Entered and Authorized by:   Comer Locket. Vassie Loll MD   Signed by:   Comer Locket Vassie Loll MD on 09/13/2009   Method used:   Printed then faxed to ...       [facility] (mail-order)       [address]       [place], [place]  [address]       Ph: [phone]       Fax: [phone]   RxID:   [id number]     Immunization History:  Pneumovax Immunization History:    Pneumovax:  historical (03/11/2007)

## 2010-07-12 NOTE — Assessment & Plan Note (Signed)
Summary: 2 M RE-CK LT FOOT/BCBS/MEDICARE/CAF   Visit Type:  Follow-up Primary Provider:  Foster Simpson  CC:  recheck left foot.  History of Present Illness: 70 years old male with history of osteoarthritis LEFT foot with acute onset of midfoot pain treated with a Cam Walker and now a foot orthotic that seems to have alleviated most of his dorsal foot pain and midfoot pain however he twisted his foot last night has some lateral pain over the fifth metatarsal.   MEDS:  Tylenol Arthritis helps some  Today, scheduled for: 2 month recheck left foot.  Review of systems weight gain    He has tenderness at the peroneal tendon LEFT foot no pain or tenderness midfoot no medial pain stable ankle joint mild tenderness over the anterior talofibular ligament  Impression stable osteoarthritis of the foot, new ankle sprain.  Continue foot orthotics Tylenol arthritis for pain ice for the ankle sprain followup as needed  Allergies: No Known Drug Allergies   Impression & Recommendations:  Problem # 1:  FOOT, ARTHRITIS, DEGEN./OSTEO (ICD-715.97) Assessment New  Orders: Est. Patient Level III ([address])  Patient Instructions: 1)  Continue the foot orthotics  2)  Take tylenol arthritis for pain  3)  apply ice to the foot at the outer part  4)  f/u as needed

## 2010-07-12 NOTE — Letter (Signed)
Summary: Statement of Medical Necessity/ [facility]  Statement of Medical Necessity/ [facility]   Imported By: Lennie Odor 09/28/2009 15:22:37  _____________________________________________________________________  External Attachment:    Type:   Image     Comment:   External Document

## 2010-07-12 NOTE — Assessment & Plan Note (Signed)
Summary: Pulmonary OV   Primary Jatziry Wechter/Referring Ina Scrivens:  Foster Simpson  CC:  4 month follow up.  Pt states breahting has improved but still has occ bad days.  Occ wheezing and chest tightness.  Denies cough.  Would like flu vac today.Marland Kitchen  History of Present Illness: 68/M  with chronic obstructive lung disease & severe obstructive sleep apnea .    Oct 13, 2009 3:27 PM Since last pulm ov 8/10 pt more dyspneic with more prod cough of thick yellow for 1.5 months SOme trace hemoptysis.  No recent cxr,  not tolerating cpap as well No prednisone or ABX in the past 2-3 weeks  January 11, 2010 9:17 AM  Download on 14 cm 6/10-7/17/11 shows good compliance, avg AHI 11/h, no excessive daytime somnolence, no snoring wt 263 unchanged from CPAP study 5/10   February 13, 2010 3:48 PM Now dx with DM2.  Pt other wise doing well. No new dypsnea. No cough. No chest pain. No wheeze.  No excess mucus.  No other new issues.  Pt desires to get medications at [facility] and desires records  Preventive Screening-Counseling & Management  Alcohol-Tobacco     Smoking Status: quit > 6 months     Packs/Day: 1.5     Year Started: 1957     Year Quit: 1988  Current Medications (verified): 1)  Crestor 40 Mg Tabs (Rosuvastatin Calcium) .Marland Kitchen.. 1 By Mouth Daily 2)  Diltiazem Hcl Er Beads 240 Mg Xr24h-Cap (Diltiazem Hcl Er Beads) .... Take 1 Tablet By Mouth Once A Day 3)  Singulair 10 Mg Tabs (Montelukast Sodium) .Marland Kitchen.. 1 By Mouth Daily 4)  Protonix 40 Mg Tbec (Pantoprazole Sodium) .Marland Kitchen.. 1 By Mouth Two Times A Day 5)  Diovan 320 Mg Tabs (Valsartan) .Marland Kitchen.. 1 By Mouth Daily 6)  Furosemide 40 Mg Tabs (Furosemide) .... 2 By Mouth Every Other Day 7)  Proair Hfa 108 (90 Base) Mcg/act  Aers (Albuterol Sulfate) .Marland Kitchen.. 1-2 Puffs Every 4-6 Hours As Needed 8)  Bayer Aspirin 325 Mg  Tabs (Aspirin) .Marland Kitchen.. 1 Podaily 9)  Diclofenac Sodium 75 Mg Tbec (Diclofenac Sodium) .... One By Mouth Bid 10)  Klor-Con M20 20 Meq Cr-Tabs (Potassium Chloride  Crys Cr) .... Take 1 Tab Two Times A Day 11)  Zetia 10 Mg Tabs (Ezetimibe) .Marland Kitchen.. 1 By Mouth Daily 12)  Cpap + 14 Cm, Full Face Mask .... Ahc 13)  Mucinex Dm Maximum Strength 60-1200 Mg Xr12h-Tab (Dextromethorphan-Guaifenesin) .... Take 1 Tablet By Mouth Two Times A Day As Needed 14)  Albuterol Sulfate (2.5 Mg/36ml) 0.083% Nebu (Albuterol Sulfate) .... Three Times A Day 15)  Ipratropium Bromide 0.03 % Soln (Ipratropium Bromide) .... Three Times A Day 16)  Nitrostat 0.4 Mg Subl (Nitroglycerin) .... As Directed As Needed 17)  Insulin .Marland Kitchen.. 42 Units  Every Morning 18)  Novolog Flexpen 100 Unit/ml Soln (Insulin Aspart) .... If Cbg Is Over 150 19)  Levemir .Marland Kitchen.. 25 Units Once Daily 20)  Uloric 80 Mg Tabs (Febuxostat) .... Take 1 Tablet By Mouth Once A Day 21)  Lisinopril 10 Mg Tabs (Lisinopril) .... Take 1 Tablet By Mouth Once A Day  Allergies (verified): No Known Drug Allergies  Past History:  Past medical, surgical, family and social histories (including risk factors) reviewed, and no changes noted (except as noted below).  Past Medical History: Reviewed history from 01/11/2010 and no changes required. htn asthma gout arthritis   ?type  ?RA  elevated chelesterol acid reflux COPD Coronary artery disease GERD  Atrial fibrillation OSA   -AHI 130 10/2008   -CPAP 19  dm  Past Surgical History: Reviewed history from 10/13/2009 and no changes required. appendectomy  RIGHT carpal tunnel release/LEFT carpal tunnel release  fluid of abdomen, removal of cyst 2010.  Past Pulmonary History:  Pulmonary History: Severe OSA     Sleep study AHI 130 10/2008  5/10 >> reviewed PSG >> severe obstructive sleep apnea AHI 130/h , lowest destauration 50%,  corrected by CPAP 19 cm Started CPAP 15 cm with medium full face quattro mask + heated humidity Reviewed download 6/3- 6/29 >> excellent compliance , few residual events AHI 9/h. reviewed compliance 6/5- 8/29 >> good compliance on 19 cm,  acceptable residual AHI reviewed 10/21- 11/27 >> good compliance, acceptable residual AHI pressure too high, changed to Fisher Paykel FF mask Download on 14 cm 3/5- 4/4 >> good compliance, residual AHI 10/h  Family History: Reviewed history from 11/04/2008 and no changes required. MI/Heart Attack  brother emphysema- uncle asthma- self  Social History: Reviewed history from 11/04/2008 and no changes required. Patient states former smoker.  1/2 ppd x 20 yrs.  Quit in 1981.   Employed as Statistician married and lives with wife children 1 dog  Review of Systems       The patient complains of shortness of breath with activity.  The patient denies shortness of breath at rest, productive cough, non-productive cough, coughing up blood, chest pain, irregular heartbeats, acid heartburn, indigestion, loss of appetite, weight change, abdominal pain, difficulty swallowing, sore throat, tooth/dental problems, headaches, nasal congestion/difficulty breathing through nose, sneezing, itching, ear ache, anxiety, depression, hand/feet swelling, joint stiffness or pain, rash, change in color of mucus, and fever.    Vital Signs:  Patient profile:   70 year old male Height:      67 inches Weight:      260.25 pounds BMI:     40.91 O2 Sat:      96 % on Room air Temp:     98.1 degrees F oral Pulse rate:   72 / minute BP sitting:   146 / 82  (right arm) Cuff size:   large  Vitals Entered By: Gweneth Dimitri RN (February 13, 2010 3:42 PM)  O2 Flow:  Room air CC: 4 month follow up.  Pt states breahting has improved but still has occ bad days.  Occ wheezing and chest tightness.  Denies cough.  Would like flu vac today. Comments Medications reviewed with patient Daytime contact number verified with patient. Gweneth Dimitri RN  February 13, 2010 3:43 PM    Physical Exam  General:  obese.   Nose:  clear nasal discharge.   Mouth:  no deformity or lesions Neck:  no masses, thyromegaly, or abnormal cervical  nodes Lungs:  clear bilaterally to auscultation and percussion Heart:  regular rate and rhythm, S1, S2 without murmurs, rubs, gallops, or clicks Abdomen:  bowel sounds positive; abdomen soft and non-tender without masses, or organomegaly Msk:  no deformity or scoliosis noted with normal posture Pulses:  pulses normal Extremities:  no clubbing, cyanosis, edema, or deformity noted Neurologic:  CN II-XII grossly intact with normal reflexes, coordination, muscle strength and tone Skin:  intact without lesions or rashes Cervical Nodes:  no significant adenopathy Psych:  alert and cooperative; normal mood and affect; normal attention span and concentration   Impression & Recommendations:  Problem # 1:  COPD (ICD-496) Assessment Unchanged stable copd on neb meds plan cont bronchodilator meds via nebulizer  flu vaccine today  Problem # 2:  HYPERSOMNIA, ASSOCIATED WITH SLEEP APNEA (ICD-780.53) Assessment: Unchanged stable osa plan cont cpap   Medications Added to Medication List This Visit: 1)  Mucinex Dm Maximum Strength 60-1200 Mg Xr12h-tab (Dextromethorphan-guaifenesin) .... Take 1 tablet by mouth two times a day as needed 2)  Novolog Flexpen 100 Unit/ml Soln (Insulin aspart) .... If cbg is over 150 3)  Levemir 100 Unit/ml Soln (Insulin detemir) .... 25 units subcutaneously daily 4)  Uloric 80 Mg Tabs (Febuxostat) .... Take 1 tablet by mouth once a day 5)  Lisinopril 10 Mg Tabs (Lisinopril) .... Take 1 tablet by mouth once a day  Complete Medication List: 1)  Crestor 40 Mg Tabs (Rosuvastatin calcium) .Marland Kitchen.. 1 by mouth daily 2)  Diltiazem Hcl Er Beads 240 Mg Xr24h-cap (Diltiazem hcl er beads) .... Take 1 tablet by mouth once a day 3)  Singulair 10 Mg Tabs (Montelukast sodium) .Marland Kitchen.. 1 by mouth daily 4)  Protonix 40 Mg Tbec (Pantoprazole sodium) .Marland Kitchen.. 1 by mouth two times a day 5)  Diovan 320 Mg Tabs (Valsartan) .Marland Kitchen.. 1 by mouth daily 6)  Furosemide 40 Mg Tabs (Furosemide) .... 2 by mouth  every other day 7)  Proair Hfa 108 (90 Base) Mcg/act Aers (Albuterol sulfate) .Marland Kitchen.. 1-2 puffs every 4-6 hours as needed 8)  Bayer Aspirin 325 Mg Tabs (Aspirin) .Marland Kitchen.. 1 podaily 9)  Diclofenac Sodium 75 Mg Tbec (Diclofenac sodium) .... One by mouth bid 10)  Klor-con M20 20 Meq Cr-tabs (Potassium chloride crys cr) .... Take 1 tab two times a day 11)  Zetia 10 Mg Tabs (Ezetimibe) .Marland Kitchen.. 1 by mouth daily 12)  Cpap + 14 Cm, Full Face Mask  .... Ahc 13)  Mucinex Dm Maximum Strength 60-1200 Mg Xr12h-tab (Dextromethorphan-guaifenesin) .... Take 1 tablet by mouth two times a day as needed 14)  Albuterol Sulfate (2.5 Mg/65ml) 0.083% Nebu (Albuterol sulfate) .... Three times a day 15)  Ipratropium Bromide 0.03 % Soln (Ipratropium bromide) .... Three times a day 16)  Nitrostat 0.4 Mg Subl (Nitroglycerin) .... As directed as needed 17)  Novolog Flexpen 100 Unit/ml Soln (Insulin aspart) .... If cbg is over 150 18)  Levemir 100 Unit/ml Soln (Insulin detemir) .... 25 units subcutaneously daily 19)  Uloric 80 Mg Tabs (Febuxostat) .... Take 1 tablet by mouth once a day 20)  Lisinopril 10 Mg Tabs (Lisinopril) .... Take 1 tablet by mouth once a day  Other Orders: Est. Patient Level III ([address])  Patient Instructions: 1)  No change in medications 2)  Flu vaccine today 3)  Return in     5-6     months     Prevention & Chronic Care Immunizations   Influenza vaccine: Historical  (02/06/2009)    Tetanus booster: Not documented    Pneumococcal vaccine: Historical  (03/11/2007)    H. zoster vaccine: Not documented  Colorectal Screening   Hemoccult: Not documented    Colonoscopy: Not documented  Other Screening   PSA: Not documented   Smoking status: quit > 6 months  (02/13/2010)  Diabetes Mellitus   HgbA1C: Not documented    Eye exam: Not documented    Foot exam: Not documented   High risk foot: Not documented   Foot care education: Not documented    Urine microalbumin/creatinine ratio: Not  documented  Lipids   Total Cholesterol: Not documented   LDL: Not documented   LDL Direct: Not documented   HDL: Not documented   Triglycerides: Not documented  Self-Management  Support :    Diabetes self-management support: Not documented   Nursing Instructions: Give Flu vaccine today   Appended Document: Pulmonary OV fax angus mcginnis  Appended Document: flu vac update      Flu Vaccine Consent Questions     Do you have a history of severe allergic reactions to this vaccine? no    Any prior history of allergic reactions to egg and/or gelatin? no    Do you have a sensitivity to the preservative Thimersol? no    Do you have a past history of Guillan-Barre Syndrome? no    Do you currently have an acute febrile illness? no    Have you ever had a severe reaction to latex? no    Vaccine information given and explained to patient? yes    Are you currently pregnant? no    Lot Number:[id number]   Exp Date:12/08/2010   Site Given  Left Deltoid IM   Carver Fila  February 13, 2010 4:36 PM      Clinical Lists Changes  Orders: Added new Service order of Flu Vaccine 22yrs + MEDICARE PATIENTS (O1308) - Signed Added new Service order of Administration Flu vaccine - MCR (M5784) - Signed Observations: Added new observation of FLU VAX VIS: 01/02/2010 version (02/13/2010 16:34) Added new observation of FLU VAXLOT: [id number] (02/13/2010 16:34) Added new observation of FLU VAXMFR: [employer] (02/13/2010 16:34) Added new observation of FLU VAX EXP: 12/08/2010 (02/13/2010 16:34) Added new observation of FLU VAX DSE: 0.13ml (02/13/2010 16:34) Added new observation of FLU VAX: Fluvax 3+ (02/13/2010 16:34)

## 2010-07-12 NOTE — Assessment & Plan Note (Signed)
Summary: 6 WK RE-CK LT FOOT/BCBS,MEDICARE/CAF   Primary Provider:  Foster Simpson   History of Present Illness: I saw [name] in the office today for a 6 WEEK  followup visit.  He is a 70 years old man with the complaint of:  LEFT FOOT.  DX:  Osteoarthritis  Treatment:  Cane, boot, and meds.  MEDS: Tylenol arthritis 500mg .  Complaints:  He says that his foot is better, he still has some swelling and tenderness.  Today, scheduled for:  Recheck.  Review of systems musculoskeletal negative.  Note he does have a previously surgically repaired thumb which is doing well.  His gait is improved.  His swelling is down.  His dorsiflexion plantarflexion remain normal.  His ankle remained stable.  His range of motion ankle remains normal.  He is note is mild tenderness over the medial side of the foot and top of the foot but normal pulse and normal sensation in the LEFT foot.  Impression improved arthritis of the foot  Recommend foot orthotics to support the foot return in 2 months continue same medicines   Allergies: No Known Drug Allergies   Impression & Recommendations:  Problem # 1:  FOOT, ARTHRITIS, DEGEN./OSTEO (ICD-715.97) Assessment Improved  Orders: Est. Patient Level III (04540)  Patient Instructions: 1)  Get spenco orthotics from [facility] 2)  wear for 2 months 3)  come back in 2 months

## 2010-09-18 LAB — BASIC METABOLIC PANEL
BUN: 21 mg/dL (ref 6–23)
Chloride: 106 mEq/L (ref 96–112)
Glucose, Bld: 100 mg/dL — ABNORMAL HIGH (ref 70–99)
Potassium: 4.3 mEq/L (ref 3.5–5.1)
Sodium: 143 mEq/L (ref 135–145)

## 2010-09-18 LAB — HEMOGLOBIN AND HEMATOCRIT, BLOOD: Hemoglobin: 13.2 g/dL (ref 13.0–17.0)

## 2010-09-25 LAB — BASIC METABOLIC PANEL
CO2: 29 mEq/L (ref 19–32)
Calcium: 9.3 mg/dL (ref 8.4–10.5)
Chloride: 104 mEq/L (ref 96–112)
GFR calc Af Amer: 51 mL/min — ABNORMAL LOW (ref >60)
GFR calc non Af Amer: 42 mL/min — ABNORMAL LOW (ref >60)
Glucose, Bld: 107 mg/dL — ABNORMAL HIGH (ref 70–99)

## 2010-09-25 LAB — CBC: RDW: 15.8 % — ABNORMAL HIGH (ref 11.5–15.5)

## 2010-10-09 ENCOUNTER — Other Ambulatory Visit: Payer: Self-pay | Admitting: Critical Care Medicine

## 2010-10-09 ENCOUNTER — Other Ambulatory Visit: Payer: Self-pay | Admitting: Internal Medicine

## 2010-10-23 NOTE — Op Note (Signed)
[name], [name]                   ACCOUNT NO.:  [id number]   MEDICAL RECORD NO.:  [id number]          PATIENT TYPE:  AMB   LOCATION:  DAY                           FACILITY:  [facility]   PHYSICIAN:  Dennie Maizes, M.D.   DATE OF BIRTH:  Oct 10, 1940   DATE OF PROCEDURE:  DATE OF DISCHARGE:                               OPERATIVE REPORT   PREOPERATIVE DIAGNOSIS:  Symptomatic left hydrocele.   POSTOPERATIVE DIAGNOSIS:  Symptomatic left hydrocele.   OPERATIVE PROCEDURE:  Left hydrocelectomy.   ANESTHESIA:  Spinal.   SURGEON:  Dennie Maizes, MD   COMPLICATIONS:  None.   ESTIMATED BLOOD LOSS:  Minimal.   DRAINS:  None.   SPECIMEN:  None.   INDICATIONS FOR PROCEDURE:  This 70 year old male had a moderate-sized  symptomatic left hydrocele.  He was taken to operating room today for  left hydrocelectomy.   DESCRIPTION OF PROCEDURE:  Spinal anesthesia was induced and the patient  was placed on the OR table in the supine position.  The lower abdomen  and genitalia were prepped and draped in sterile fashion.  Examination  revealed a moderate-sized left hydrocele.  A transverse left hemiscrotal  incision was then made.  The layer of the scrotal skin was incised to  expose the hydrocele sac.  With sharp and blunt dissection, the  hydrocele sac was then separated on the scrotal wall.  Hydrocele sac was  then opened and about 150 mL of clear yellow fluid was drained.  Examination revealed normal testis and epididymis.  There is no  abnormality in the testis.  The hydrocele sac was reversed behind the  spermatic structures and sutured with 3-0 Vicryl interlocking sutures.  Hemostasis obtained by cauterization.  There was no active bleeding at  the time.  The testis was then repositioned in the scrotum.  The  scrotal incision was closed in two layers using 3-0 chromic gut.  The  scrotal dressing and support were applied.  Estimated blood loss was  minimal.  The patient remained stable  throughout the procedure.  Sponges  and instruments were correct x2 at the time of closure.  The patient was  transferred to the PACU in a satisfactory addition.      Dennie Maizes, M.D.  Electronically Signed     SK/MEDQ  D:  08/01/2008  T:  08/02/2008  Job:  [id number]   cc:   Angus G. Renard Matter, MD  Fax: [phone]

## 2010-10-23 NOTE — Assessment & Plan Note (Signed)
[facility]                             PULMONARY OFFICE NOTE   NAME:LEECouper, Juncaj                          MRN:          [id number]  DATE:11/11/2006                            DOB:          Sep 05, 1940    Mr. Lawhorn is a 70 year old African-American male with a history of chronic  obstructive lung disease and asthmatic bronchitis.  Patient comes to the  office today with no change in any level of dyspnea.  He has occasional  mucus production.  No chest discomfort is noted.  Patient maintains  Flovent 220 mcg strength, 2 sprays b.i.d., Foradil 1 spray b.i.d.,  Spiriva daily, Protonix 3 mg b.i.d., Singulair 10 mg daily.   PHYSICAL EXAMINATION:  VITAL SIGNS:  Temp 98, blood pressure 116/72,  pulse 76.  Saturation 96-97% on room air.  CHEST:  Distant breath sounds with prolonged expiratory phase.  No  wheeze or rhonchi.  CARDIAC:  Regular rate and rhythm without S3.  Normal S1 and S2.  ABDOMEN:  Soft, nontender.  EXTREMITIES:  No clubbing or edema.  SKIN:  Clear.   IMPRESSION:  This patient has asthmatic bronchitis without flare.   The plan is to maintain inhaled medicines as currently dosed, and we  will see the patient back in return followup.     Charlcie Cradle Delford Field, MD, [facility]  Electronically Signed    PEW/MedQ  DD: 11/11/2006  DT: 11/11/2006  Job #: [id number]

## 2010-10-23 NOTE — Assessment & Plan Note (Signed)
[name]                       [facility] OFFICE NOTE   NAME:[name]                          MRN:          [id number]  DATE:09/05/2008                            DOB:          26-Jun-1940    I was asked by Dr. Amanda [name] in the [facility] in [place]  to clear [name] for surgery.   He is a pleasant 70 year old black gentleman who has a history of  paroxysmal atrial fibrillation with a CHADS II score of 1.  He was last  seen by Dr. Simona [name] of our group on May 25, 2008.   He remains on diltiazem as well as enteric-coated aspirin.   He also has a history of nonobstructive coronary artery disease with  normal left ventricular systolic function.   MEDICATIONS:  His meds are:  1. Flovent 2 puffs b.i.d.  2. Singulair 10 mg per day.  3. Protonix 40 mg p.o. b.i.d.  4. Diltiazem CD 240 mg per day.  5. Allopurinol 300 mg a day.  6. Foradil 1 puff b.i.d.  7. Spiriva 1 cap daily.  8. Crestor 40 mg per day.  9. Furosemide 80 mg per day.  10.Colchicine 0.6 one every other day.  11.Potassium 20 mEq b.i.d.  12.Enteric-coated aspirin 325 mg a day.  13.ProAir inhaler.  14.Zetia 10 mg a day.   PHYSICAL EXAMINATION:  GENERAL:  He is in no acute distress.  VITAL SIGNS:  His blood pressure 139/82, which is actually pretty good  for him.  His heart rate is 70 and he is in sinus rhythm by EKG.  His  EKG is stable.  His weight is 256 up 6 and respiratory rate is 18.  HEENT:  He has somewhat raspy voice.  Sclerae muddy.  NECK:  Supple.  There is no obvious JVD.  Carotids are full without  bruits.  Thyroid is not enlarged.  CHEST:  Lungs are clear to auscultation and percussion.  HEART:  Nondisplaced PMI.  Normal S1 and S2.  ABDOMEN:  Obese with good bowel sounds.  Organomegaly cannot be  assessed.  EXTREMITIES:  No significant edema today, may be trace.  Pulses are  intact.  There is no sign of DVT.  NEUROLOGIC:   Intact.   ASSESSMENT AND PLAN:  [name] is stable from his history of  nonobstructive coronary artery disease, atrial fibrillation, and  hypertension.  His weight is up a bit, but this is probably not fluid.  His medicines were stable.   They are planning on doing right hand surgery, which could be done by a  local block or general anesthesia.  We will clear him at low risk either  way.   We will plan on seeing him back again in several months.     Christian C. Daleen Squibb, MD, [facility]  Electronically Signed    TCW/MedQ  DD: 09/05/2008  DT: 09/06/2008  Job #: [id number]   cc:   Dionne Ano. [name], M.D.

## 2010-10-23 NOTE — Procedures (Signed)
NAMEHAYGEN, [name]:  [id number]   MEDICAL RECORD NO.:  [id number]          PATIENT TYPE:  OUT   LOCATION:  SLEEP CENTER                 FACILITY:  [facility]   PHYSICIAN:  Oretha Milch, MD      DATE OF BIRTH:  August 24, 1940   DATE OF STUDY:                            NOCTURNAL POLYSOMNOGRAM   REFERRING PHYSICIAN:   INDICATION FOR STUDY:  Excessive daytime somnolence and tiredness in  this 70 year old gentleman with loud snoring, witnessed apneas,  hypertension, obesity, and morning headaches.  Weight was 262 pounds,  height 5 feet 7 inches, BMI 41, neck size 19 inches.   EPWORTH SLEEPINESS SCORE:  10.   MEDICATIONS:  Bedtime medication was Lunesta at 10:30 p.m.   This overnight polysomnogram was performed with a sleep technologist in  attendance.  EEG, EOG, EMG, EKG, and respiratory parameters were  recorded.  Sleep stages, arousals, respiratory data, and limb movements  were scored according to criteria laid out by the American Academy of  Sleep Medicine.   Lights out was at 10:51 p.m., lights on was at 5:01 a.m.  CPAP  intervention was initiated at 1:26 a.m.   SLEEP ARCHITECTURE:  During the diagnostic portion, total sleep time was  135 minutes with a sleep period time of 147 minutes and sleep efficiency  of 87%, sleep latency was 7.5 minutes.  Sleep stages of the percentage  of total sleep time was N1 10%, N2 64%, and REM sleep 26% (35.5  minutes).  During this CPAP titration portion, 48.5 minutes of REM sleep  was noted.   RESPIRATORY DATA:  During the baseline portion, there were a total of  222 obstructive apneas, 0 central apneas, 5 mixed apneas, and 65  hypopneas with an apnea-hypopnea index of 130 events per hour.  Due to  this degree of respiratory disturbance, CPAP was initiated at 5 cm with  a full face mask and titrated to a final level of 20 cm.  At a level of  13 cm for 32 minutes of sleep including 9 minutes of REM sleep, 4  obstructive  apneas and 6 hypopneas were noted for an AHI of 19 events  per hour and a low saturation of 83%.  At a level of 17 cm for 17  minutes including 4 minutes of REM sleep, 1 obstructive apnea and 2  hypopneas were noted for an AHI of 10.6 events per hour and a low  desaturation of 92%.  At a level of 19 cm for 39 minutes, 1 obstructive  apnea and 1 central apnea, 2 mixed apneas and 2 hypopneas were noted for  an AHI of 9.2 and a low desaturation of 92%.  This appears to be the  optimal level used in the study at a level of 20 cm for 2 minutes, no  events were noted.   AROUSAL DATA:  During the diagnostic portion, the arousal index was 38  events per hour.  During the titration portion, this was decreased to 15  events per hour.   LIMB MOVEMENT DATA:  No significant limb movements were noted.   OXYGEN DATA:  Lowest desaturation during the diagnostic portion was 50%.  During the titration portion, the lowest desaturation on 19 cm was 92%.   CARDIAC DATA:  Few PVCs were noted.  No arrhythmias were noted.  Lowest  heart rate during the diagnostic portion was 32 beats per minute and  during the titration portion was 37 beats per minute.   MOVEMENT-PARASOMNIA:  None   DISCUSSION:  Severe sleep disorder breathing corrected by sleep apnea  with a ResMed Quattro medium full-face mask and optimal pressure of 19  cm.  He felt refreshed the morning after the study.  Few central apneas  on the higher levels of CPAP.   IMPRESSION:  1. Severe obstructive sleep apnea with hypopneas causing oxygen      desaturation and sleep fragmentation.  2. This was corrected by continuous positive airway pressure with a      medium full-face mask at an optimal pressure of 19 cm.  3. No evidence of cardiac arrhythmias, limb movements, or behavioral      disturbance during sleep.   The desaturations were pronounced likely due to underlying pulmonary  disease.   RECOMMENDATIONS:  1. CPAP should be initiated  with a goal pressure of 19 cm with a      medium full-face mask.  2. He should be asked to avoid medications with sedative side effects.  3. He should be asked to avoid driving when sleepy.      Oretha Milch, MD  Electronically Signed     RVA/MEDQ  D:  10/14/2008 16:51:37  T:  10/15/2008 05:57:29  Job:  [id number]   cc:   Charlcie Cradle. Delford Field, MD, FCCP  [address]  [address]  [place] [address]

## 2010-10-23 NOTE — Assessment & Plan Note (Signed)
[facility]                             PULMONARY OFFICE NOTE   NAME:[name], [name]                          MRN:          [id number]  DATE:03/16/2007                            DOB:          1941-05-01    [name] returns in followup, 70 year old [place] American male.  History  of chronic obstructive lung disease, asthmatic bronchitis noting  increased heaviness in the right side of the chest, increased dyspnea  for 2 weeks, increased nasal drainage.   Maintains:  1. Flovent 2 sprays b.i.d. 220 mcg strength.  2. Singulair 10 mg daily.  3. Protonix 40 mg b.i.d.  4. Spiriva daily.   EXAMINATION:  Temperature was 97, blood pressure 136/64, pulse 75,  saturation was 94% room air.  CHEST:  Showed diminished breath sounds with prolonged expiratory phase,  no wheeze or rhonchi were noted.  CARDIAC:  Showed a regular rate and rhythm without S3, normal S1-S2.  ABDOMEN:  Soft, nontender.  EXTREMITIES:  Showed no edema or clubbing.  SKIN:  Clear.   IMPRESSION:  That of mild asthmatic bronchitic flare.   PLAN:  For the patient is to receive pulse prednisone 40 mg a day with a  rapid taper and will see the patient back in return followup.     Charlcie Cradle Delford Field, MD, [facility]  Electronically Signed    PEW/MedQ  DD: 03/16/2007  DT: 03/16/2007  Job #: [id number]

## 2010-10-23 NOTE — H&P (Signed)
NAMEJERMIAH, SODERMAN NO.:  [id number]   MEDICAL RECORD NO.:  [id number]          PATIENT TYPE:  AMB   LOCATION:  DAY                           FACILITY:  [facility]   PHYSICIAN:  Vickki Hearing, M.D.DATE OF BIRTH:  March 20, 1941   DATE OF ADMISSION:  DATE OF DISCHARGE:  LH                              HISTORY & PHYSICAL   Mr. Geister is 70 years old status post recent right carpal tunnel release.  He was treated with vitamin B6, Neurontin, anti-inflammatories and  splinting; did not improve.  He has positive studies indicating carpal  tunnel syndrome.   His medications include Crestor, diltiazem, Singulair, Protonix, Diovan,  furosemide, Flovent, ProAir, Spiriva, and potassium.   No known allergies.   Previous medical history of hypertension, asthma, gout, arthritis,  cholesterol elevation, acid reflux, COPD, coronary artery disease,  gastroesophageal reflux, and atrial fibrillation.   Previous history of appendectomy.  Previous history of right carpal  tunnel release.   Family history of MI.  His brother had a heart attack.   The patient quit smoking in 1988.   The patient's review of system is negative for findings under the  general, cardiac, respiratory, GI, GU, neuro, musculoskeletal, endo,  psych, derm, HEENT, immunologic, or lymphatic systems other than stated.   PHYSICAL EXAMINATION:  VITAL SIGNS:  Stable.  GENERAL APPEARANCE:  Normal development, nutrition, and grooming, no  deformity.  Body habitus medium.  LYMPHATICS:  Lymph nodes were negative.  CARDIOVASCULAR:  Observation and palpation were normal.  SKIN:  Inspection and palpation revealed no abnormalities.  Carpal  tunnel incision is healing on the right.  NEUROLOGIC:  Normal reflexes but decreased sensation in the thumb,  index, and long finger on the left hand.  He has some swelling of the  small joints as well.  He has a positive compression test over the left  carpal tunnel.  He has  decreased grip strength.  He is awake, alert, and  oriented.  Mood and affect are normal.  Gait is normal.   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   PLAN:  Left carpal tunnel release on January 08, 2008, with a postop visit  scheduled for January 18, 2008, and a nurse check next week just for  wound check and dressing change.      Vickki Hearing, M.D.  Electronically Signed     SEH/MEDQ  D:  01/07/2008  T:  01/07/2008  Job:  [id number]   cc:   [facility]

## 2010-10-23 NOTE — Assessment & Plan Note (Signed)
[facility]                       [place] [facility] OFFICE NOTE   NAME:[name], [name]                          MRN:          [id number]  DATE:05/25/2008                            DOB:          1940/09/27    PRIMARY CARE PHYSICIAN:  Angus G. Renard Matter, MD   REASON FOR VISIT:  Scheduled followup.   HISTORY OF PRESENT ILLNESS:  This is my first meeting with [name].  He  is a very pleasant gentleman with a history of paroxysmal atrial  fibrillation associated with mild coronary atherosclerosis and  hyperlipidemia.  He has been managed with a strategy of risk factor  modification and was taken off Coumadin back in September 2008,  switching to enteric-coated aspirin 325 mg daily at that time.  He  continues on long-acting diltiazem at 240 mg daily.  His  electrocardiogram today shows sinus rhythm with a single premature  ventricular complex and nonspecific ST-T wave changes which were  actually less pronounced compared to previous tracings.  He is not  reporting any anginal chest pain.  He is working on obtaining a Proofreader.  He has been trying to walk regularly.   PRESENT MEDICATIONS:  1. Flovent 220 two puffs b.i.d.  2. Diovan/hydrochlorothiazide 160/25 mg p.o. daily.  3. Singulair 10 mg p.o. daily.  4. Protonix 40 mg p.o. daily.  5. Diltiazem CD 240 mg p.o. daily.  6. Allopurinol 300 mg p.o. daily.  7. Foradil 1 puff b.i.d.  8. Spiriva 1 capsule daily.  9. Crestor 40 mg p.o. daily.  10.Lasix 80 mg p.o. every other day.  11.Colchicine 0.6 mg p.o. every other day.  12.Potassium 20 mEq p.o. b.i.d.  13.Enteric-coated aspirin 325 mg p.o. daily.  14.ProAir inhaler.  15.Zetia 10 mg p.o. daily.  16.Sublingual nitroglycerin 0.4 mg p.r.n.   REVIEW OF SYSTEMS:  As described in the history of present illness.  Otherwise, negative.   PHYSICAL EXAMINATION:  Blood pressure 160/90, heart rate is in the 80s  to 90s in sinus rhythm,  weight is 250 pounds up from 241.  This is an  obese male in no acute distress.  Examination of the neck reveals no  elevated jugular venous pressure.  No loud bruits.  No thyromegaly is  noted.  Lungs clear without labored breathing at rest.  Cardiac exam  reveals a regular rate and rhythm.  No S3 gallop or pericardial rub.  Abdomen is soft and nontender.  Unable to palpate liver edge.  Extremities exhibit no significant pitting edema.   IMPRESSION AND RECOMMENDATIONS:  1. History of paroxysmal atrial fibrillation although without any      obvious major recurrences and in normal sinus rhythm today.  He      continues on full-dose aspirin with diltiazem CD.  CHADS2 score at      this point is 1.  We will plan to see him back over the next 6      months.  2. Mild coronary atherosclerosis documented previously, and      symptomatically stable without angina.  We would continue to aim  for aggressive lipid control.  He is on Crestor, and this has been      followed with excellent LDL control in the past, most recently in      the 70s.     Jonelle Sidle, MD  Electronically Signed    SGM/MedQ  DD: 05/25/2008  DT: 05/26/2008  Job #: [id number]   cc:   Angus G. Renard Matter, MD

## 2010-10-23 NOTE — Op Note (Signed)
[name], [name]:  [id number]   MEDICAL RECORD NO.:  [id number]          PATIENT TYPE:  AMB   LOCATION:  DAY                           FACILITY:  [facility]   PHYSICIAN:  Vickki Hearing, M.D.DATE OF BIRTH:  1941-02-16   DATE OF PROCEDURE:  12/18/2007  DATE OF DISCHARGE:                               OPERATIVE REPORT   HISTORY:  This is a 70 year old male who had long history of right upper  extremity pain and swelling and triggering of his ring finger.  He was  followed for carpal tunnel and osteoarthritis of the hand for several  months.  He failed treatment trial of vitamin B6, Neurontin, anti-  inflammatories, and splinting.  He presents for a carpal tunnel release  and a right ring finger trigger release.   PREOPERATIVE DIAGNOSES:  Right carpal tunnel syndrome, right ring finger  trigger.   POSTOPERATIVE DIAGNOSES:  Right carpal tunnel syndrome, right ring  finger trigger.   PROCEDURE:  Right carpal tunnel release and right ring finger A1 pulley  release.   SURGEON:  Vickki Hearing, M.D.   ASSISTANTS:  There were no assistants.   ANESTHESIA:  It was under a regional block.   FINDINGS:  Stenosis of the carpal tunnel and the A1 pulley of the right  ring finger.   SPECIMENS:  There were no specimens.   ESTIMATED BLOOD LOSS:  0.   COMPLICATIONS:  There were no complications.   COUNTS:  The counts were correct.   DETAILS OF PROCEDURE:  This patient was identified as [name].  [name] in  the preop area.  His right ring finger and carpal tunnel were marked as  a surgical site by the patient.  I then countersigned his markings with  my initials.  The history and physical was updated.  He was taken to  surgery.  He was given a gram of Ancef at approximately 7:37 a.m.  He  was placed supine.  His right upper extremity had a regional block with  a Bier block and then it was prepped with DuraPrep, draped sterilely.   The time-out procedure  then commenced and everyone agreed that [name]  was having a right carpal tunnel release and a right ring finger A1  pulley release.   The ring finger was addressed first.  A transverse incision was made in  the distal palmar crease.  The subcutaneous tissue was divided with  blunt dissection.  The A1 pulley was exposed and released with sharp  dissection.  Flexion/extension of the digit showed there was no further  stenosing.  The wound was irrigated, closed with 3-0 nylon interrupted  suture.   The carpal tunnel was then addressed.  A straight incision was made in  line with the radial border of the ring finger from the distal part of  the carpal tunnel to the distal transverse wrist crease.  Subcutaneous  tissue was divided bluntly, palmar fascia was divided sharply.  Blunt  dissection was carried out at the distal portion of the transverse  carpal ligament and then  blunt dissection was done beneath the ligament  and then the ligament was released sharply.  The contents of the carpal  tunnel were inspected.  Although, there were no space-occupying lesions,  a synovial proliferation, which was severe was noted.  The nerve  appeared to be somewhat discolored and the shape was typical hourglass  shape of carpal tunnel.  The wound was irrigated, closed with 3-0 nylon  suture, plain Marcaine was injected into the incision.   The tourniquet was released, his fingers had good color, and a dressing  was applied.  The patient went to Recovery Room in stable condition.   POSTOPERATIVE PLAN:  He will be discharged on Lorcet Plus 1 q.4 p.r.n.  for pain.  His postop appointment is on December 22, 2007.   He is encouraged to elevate the hand and to move the fingers.      Vickki Hearing, M.D.  Electronically Signed     SEH/MEDQ  D:  12/18/2007  T:  12/18/2007  Job:  [id number]

## 2010-10-23 NOTE — Op Note (Signed)
[name], [name]:  [id number]   MEDICAL RECORD NO.:  [id number]          PATIENT TYPE:  AMB   LOCATION:  DAY                           FACILITY:  [facility]   PHYSICIAN:  Vickki Hearing, M.D.DATE OF BIRTH:  1940-06-25   DATE OF PROCEDURE:  DATE OF DISCHARGE:                               OPERATIVE REPORT   OPERATIVE REPORT:  Carpal tunnel release.   HISTORY:  [name] is 70 years old.  He is coming in for left carpal  tunnel release.  He was treated with vitamin B6, Neurontin, anti-  inflammatories, and splinting; did not improve.  He has documented nerve  conduction studies revealing carpal tunnel syndrome.  He is status post  a right carpal tunnel release and A1 pulley release of the right ring  finger approximately a month ago.   PREOPERATIVE DIAGNOSIS:  Left carpal tunnel syndrome.   POSTOPERATIVE DIAGNOSIS:  Left carpal tunnel syndrome.   PROCEDURE:  Open left carpal tunnel release.   OPERATING SURGEON:  Vickki Hearing, MD   ANESTHESIA:  Bier block.   ASSISTANT:  None.   OPERATIVE FINDINGS:  The nerve was compressed and discolored.   SPECIMENS:  None.   The case was clean.   ESTIMATED BLOOD LOSS:  Minimal.   COMPLICATIONS:  None.   COUNTS:  Correct.   PROCEDURE IN DETAIL:  In the preop area, [name] was identified  properly.  His history and physical were updated.  His surgical site was  marked.  He was given Ancef 1 g.  He was taken to the surgery.  He had a  Bier block.  His left hand and arm were prepped with DuraPrep and draped  sterilely.  A time-out procedure was completed.   A volar incision was made over the carpal tunnel.  Subcutaneous tissue  was divided.  Palmar fascia was sharply divided.  The distal aspect of  the transverse carpal ligament was identified.  Blunt dissection was  performed beneath the transverse carpal ligament and the ligament was  released with sharp dissection.  The contents of the carpal tunnel  were  identified including the median nerve, which was discolored and  compressed.  The wound was irrigated and closed with 3-0 nylon suture in  interrupted fashion and then injected with a local anesthetic.  A  sterile bandage was applied.  The Tourniquet was released.  The patient  was taken to recovery room in stable condition.   POSTOPERATIVE PLAN:  Wound check will be done next week; stitches out at  10 days.  The patient is encouraged to move his fingers.  He is  discharged on Lorcet Plus 1 q.4. p.r.n. for pain #40 with one refill.      Vickki Hearing, M.D.  Electronically Signed     SEH/MEDQ  D:  01/08/2008  T:  01/08/2008  Job:  [id number]

## 2010-10-23 NOTE — Assessment & Plan Note (Signed)
[facility]                       [place] CARDIOLOGY OFFICE NOTE   NAME:LEEZale, Marcotte                          MRN:          [id number]  DATE:07/20/2007                            DOB:          [birth year]    IDENTIFICATION:  Mr. Lycan is a 70 year old gentleman with a history of  atrial fibrillation (one episode after catheterization), mild CAD,  dyslipidemia, and asthma.  I last saw him in September 2008.   In the interval, he notes no change in his breathing.  Note, he was seen  by Dr. Delford Field back in October 2008.  He denies chest pain; activity  level is relatively unchanged.  He did not take his medications yet,  because he does not get up until later and he goes to work a little  later.   CURRENT MEDICATIONS:  1. Flovent 2 puffs b.i.d.  2. Diovan and hydrochlorothiazide 160/25.  3. Singulair 10.  4. Protonix 40 b.i.d.  5. Diltiazem 240 daily.  6. Allopurinol 300.  7. Foradil one puff b.i.d.  8. Spiriva.  9. Crestor 20 ?  10.Lasix 80 every other day.  11.Colchicine 0.6 every other day.  12.Potassium 20 mEq b.i.d.  13.Aspirin 325.  14.ProAir inhaler.   PHYSICAL EXAMINATION:  The patient is in no distress.  Blood pressure  158/64, pulse 64 and regular.  Weight 241.  LUNGS:  Clear.  No wheezes.  Moving air.  CARDIAC EXAM:  Regular rate and rhythm, S1 and S2.  No S3.  ABDOMEN:  Benign and obese.  EXTREMITIES:  No edema.   IMPRESSION:  1. Atrial Fibrillation.  It does not appear that he has had any      recurrent spells.  2. Mild Coronary Artery Disease.  Continue on medical therapy.  3. Dyslipidemia.  Will need fasting lipids at his convenience.  4. Hypertension.  He did not take his medicines, but will.  I would      not push him anymore from this.   I will see him back in the late fall, or sooner if problems develop.  I  will be in touch with him regarding lipids.     Pricilla Riffle, MD, [facility]  Electronically Signed    PVR/MedQ  DD: 07/20/2007  DT: 07/21/2007  Job #: [id number]

## 2010-10-23 NOTE — Op Note (Signed)
[name], [name]:  [id number]   MEDICAL RECORD NO.:  [id number]          PATIENT TYPE:  OIB   LOCATION:  1611                         FACILITY:  [facility]   PHYSICIAN:  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   PROCEDURES:  1. Right thumb CMC arthroplasty (removal of trapezium at the base of      the thumb joint level).  2. Tendon transfer APL two-thirds proper portion to the FCR and back      upon the APL proper and the FCR with multiple figure-of-eight      throws (Weilby tendon transfer) right basilar thumb joint.  3. Abductor pollicis longus tenodesis (shortening of wrist extensor at      the wrist forearm level to prevent dorsolateral escape right      basilar thumb joint).  4. Stress radiography.   SURGEON:  Dr. Dominica Severin.   ASSISTANT:  Karie Chimera, P.A.-C.   COMPLICATIONS:  None.   ANESTHESIA:  General with preoperative block.   TOURNIQUET TIME:  Less than 90 minutes.   INDICATIONS FOR PROCEDURE:  This patient is a 70 year old male who  presents with above-mentioned diagnoses.  I have counseled him in  regards to the risks and benefits of the surgery, including risks of  infection, bleeding, anesthesia, damage to normal structures and failure  of the surgery to accomplish its intended goals of relieving symptoms  and restoring function.  With this in mind, he desires to proceed.  All  questions have been encouraged and answered preoperatively.   OPERATIVE PROCEDURE:  The patient was seen by myself and Anesthesia,  taken to operative suite and underwent a  smooth induction of general  anesthesia.  Following this, he was prepped and draped in the usual  sterile fashion with Betadine scrub and paint.  The arm was then  elevated, tourniquet was inflated to 250 mmHg and a dorsal radial  incision was made at the basilar thumb joint level.  EPL and APL tendon  sheaths were identified and released.  Superficial radial nerve and  radial artery were protected at all times.  Following this, the patient  then underwent a capsular incision about the trapezium.  The trapezium  was incised over the capsule.  I then removed the trapezium piecemeal  and performed FCR tenolysis, tenosynovectomy and removal of loose  bodies.  I checked this under stress radiography given the very dense  and tremendously arthritic nature of the joint.  X-rays confirmed  complete removal.  I should note the FCR tendon had somewhat of an  aberrant insertion on the first metacarpal region and volar capsule as  opposed to a scout insertion over the second metacarpal base region.  This underwent a tenolysis of  course and was thoroughly explored.   Following this which completed the arthroplasty portion of the  procedure, I then made a counter incision and harvested a two-thirds  proper portion of the APL proper.  Given the aberrant anatomy of the FCR  tendon, I felt that a Weilby tendon transfer would be most beneficial  and did not pursue a first metacarpal drill hole.  I retrieved the two-  thirds proper tendon graft and placed this around the FCR through a slit  and then around the APL proper and back upon both tendons with multiple  figure-of-eight throws.  This was done without difficulty and inset with  3-0 FiberWire with multiple throws.  He had excellent suspension and I  was quite pleased with the tendon transfer.  Following this, I then  performed placement of Gelfoam in the space created.  Following this, I  then performed an abductor pollicis longus tenodesis with  complex  capsular closure.  This was a shortening of the wrist extensor at the  wrist forearm level to prevent dorsolateral escape.  With this  performed, I then made final copy x-rays with the first metacarpal  nicely suspended and a beautiful space created.  I was pleased with this  and the findings.  I irrigated copiously with the tourniquet deflated.  I checked the superficial radial nerve which looked well and closed the  wounds with Prolene.  We placed him in a sterile dressing, thumb spica  splint and he was admitted for IV antibiotics and general postop  observation.  He will continue allopurinol and colchicine once a day to  prevent any gouty attack.  We will monitor his pulmonary function as  well.  I have discussed with he and his family these notes and we will  plan to see him in 10-14 days after discharge from the hospital as he  will be staying for 23-hour observatory patient status.      [name], M.D.  Electronically Signed     WMG/MEDQ  D:  10/25/2008  T:  10/25/2008  Job:  [id number]

## 2010-10-23 NOTE — H&P (Signed)
[name], [name]                   ACCOUNT NO.:  [id number]   MEDICAL RECORD NO.:  [id number]          PATIENT TYPE:  AMB   LOCATION:  DAY                           FACILITY:  [facility]   PHYSICIAN:  Dennie Maizes, M.D.   DATE OF BIRTH:  Aug 10, 1940   DATE OF ADMISSION:  08/01/2008  DATE OF DISCHARGE:  LH                              HISTORY & PHYSICAL   CHIEF COMPLAINT:  Left scrotal swelling and discomfort.   HISTORY OF PRESENT ILLNESS:  This 70 year old male was referred to me by  Dr. Renard [name] for evaluation and management of left scrotal swelling.  The  patient had injury to the left scrotum during moving heavy objects in  November 2009.  Noted swelling of the scrotum has been increasing in  size.  He has some discomfort as well as swelling at present.  No  history of urinary symptoms.  He has no voiding difficulty.  He has  urinary frequency x4 to 5 and nocturia x2.  He has good urinary flow.  There is no history of hematuria, urinary tract infections or  urolithiasis.   PAST MEDICAL HISTORY:  History of bronchial asthma, degenerative  arthritis, hypertension, heart disease, status post TURP.  Status post  appendectomy.   MEDICATIONS:  1. Protonix 40 p.o. b.i.d.  2. Crestor 40 mg p.o. daily.  3. Singulair 10 mg p.o. daily.  4. Diltiazem ER 240 mg p.o. daily.  5. Voltaren 70 mg one p.o. b.i.d.  6. Diovan 320 mg p.o. daily.  7. Lasix 40 mg p.o. daily.  8. Allopurinol 10 mg p.o. daily.  9. Colchicine 0.6 mg p.o. every other day.  10.Zetia 10 mg 1 p.o. daily.  11.Proventil.  12.ProAir.  13.Spiriva inhaler.  14.Foradil nebulizer b.i.d.   ALLERGIES:  None.   EXAMINATION:  VITAL SIGNS:  Height 5 feet 7 inches, weight 28 pounds.  HEAD, EYES, EARS, NOSE AND THROAT:  Normal.  LUNGS:  Clear to auscultation.  HEART: Regular rate and rhythm.  No murmurs.  ABDOMEN:  Soft.  No palpable flank mass or CVA tenderness.  GENITOURINARY:  Bladder is not palpable.  Penis normal.  There is  moderate sized cystic swelling of left hemiscrotum suggestive of left  hydrocele.  Right testis normal.  RECTAL:  Reveals benign prostate.   IMPRESSION:  Symptomatic left hydrocele.   PLAN:  I discussed the patient regarding management options.  Scheduled  to undergo left hydrocelectomy at [facility].  I have informed  regarding the diagnosis, operative details, alternative treatment,  outcome, possible risks and complications and he has agreed for the  procedure to be done.  He was advised to discontinue the warfarin as  well as aspirin for a week prior to the procedure.      Dennie Maizes, M.D.  Electronically Signed     SK/MEDQ  D:  07/31/2008  T:  07/31/2008  Job:  [id number]   cc:   Angus G. Christian Matter, MD  Fax: [phone]

## 2010-10-23 NOTE — Assessment & Plan Note (Signed)
[facility]                       [place] CARDIOLOGY OFFICE NOTE   NAME:Cara, [name]                          MRN:          [id number]  DATE:02/13/2007                            DOB:          1940-11-16    IDENTIFICATION:  [name] is a 70 year old gentleman that was seen in the  hospital back in the winter of 2007 for chest pain.  A Myoview scan was  normal.  Note, he had no significant CAD on cath several years ago.  The  patient also has a history of hypertension and an episode of documented  AFib in 2004, after a cath.  He has been on Coumadin since.   In the interval, he has done okay from a cardiac standpoint.  He denies  palpitations.  No chest pain.  By report, it says he sensed the  palpitations in 2004.  His breathing has been steady.  He is followed by  Dr. Danise Mina for his asthmatic bronchitis.   CURRENT MEDICATIONS:  1. Flovent 2 puffs b.i.d.  2. Diovan HCTZ 160/25 daily.  3. Singulair 10 daily.  4. Protonix 40 b.i.d.  5. Diltiazem CD 240 daily.  6. Coumadin as directed.  7. Allopurinol 300.  8. Foradil puff b.i.d.  9. Spiriva daily.  10.Crestor 20 daily.  11.Lasix 80 every other day.  12.Colchicine 0.6 every other day.  13.Potassium 20 mEq b.i.d.   PHYSICAL EXAMINATION:  GENERAL:  The patient is in no distress.  VITAL SIGNS:  Blood pressure is 140/88, pulse is 80 and regular, weight  240 - up from 236 last visit.  LUNGS:  Some distant breath sounds.  Faint wheeze on the left.  Decreased expiratory flow.  Moving air.  CARDIAC:  Regular rate and rhythm.  S1 S2.  No S3.  No murmur.  ABDOMEN:  Benign.  Obese.  EXTREMITIES:  No significant edema.   IMPRESSION:  1. Atrial fibrillation one documented episode in the past.  It has      been several years.  His [place] score is low.  I would recommend a 48-      hour Holter monitor.  If this is negative for atrial fibrillation,      I would put him on full dose aspirin.  We will be  in touch with      him.  2. History of coronary artery disease, mild by catheterization in      2004, risk factor management.  3. Asthma bronchitis followed in pulmonary.  4. Dyslipidemia on Crestor.  Last lipid panel in July, LDL was 116,      HDL of 49, total cholesterol of 187.  Actually question if this was      on Lipitor.  We will need to review and be back in touch with the      patient.  Again, goal LDL around 100.   I will set to see the patient back otherwise in the spring, sooner if  problems develop. We will be in touch with him regarding the test  results.     Pricilla Riffle, MD,  [facility]  Electronically Signed    PVR/MedQ  DD: 02/13/2007  DT: 02/13/2007  Job #: [id number]   cc:   Angus G. Renard Matter, MD

## 2010-10-24 ENCOUNTER — Other Ambulatory Visit: Payer: Self-pay

## 2010-10-24 ENCOUNTER — Other Ambulatory Visit: Payer: Self-pay | Admitting: Cardiology

## 2010-10-24 MED ORDER — NITROGLYCERIN 0.4 MG SL SUBL: 0.4000 mg | SUBLINGUAL_TABLET | SUBLINGUAL | Status: DC | PRN

## 2010-10-24 MED ORDER — LISINOPRIL 10 MG PO TABS: 10.0000 mg | ORAL_TABLET | Freq: Every day | ORAL | Status: DC

## 2010-10-24 MED ORDER — ROSUVASTATIN CALCIUM 40 MG PO TABS: 40.0000 mg | ORAL_TABLET | Freq: Every day | ORAL | Status: DC

## 2010-10-24 MED ORDER — DILTIAZEM HCL ER COATED BEADS 240 MG PO CP24: 240.0000 mg | ORAL_CAPSULE | Freq: Every day | ORAL | Status: DC

## 2010-10-24 MED ORDER — FUROSEMIDE 40 MG PO TABS: 40.0000 mg | ORAL_TABLET | Freq: Two times a day (BID) | ORAL | Status: DC

## 2010-10-24 MED ORDER — EZETIMIBE 10 MG PO TABS: 10.0000 mg | ORAL_TABLET | Freq: Every day | ORAL | Status: DC

## 2010-10-24 MED ORDER — VALSARTAN 320 MG PO TABS: 320.0000 mg | ORAL_TABLET | Freq: Every day | ORAL | Status: DC

## 2010-10-24 MED ORDER — POTASSIUM CHLORIDE CRYS ER 20 MEQ PO TBCR: 20.0000 meq | EXTENDED_RELEASE_TABLET | Freq: Two times a day (BID) | ORAL | Status: DC

## 2010-10-24 NOTE — Telephone Encounter (Signed)
PT WALK IN TODAY TO SWITCH HIS MEDS TO A MAIL ORDER. PT HAS NOT BEEN SEEN SINCE 08/2008 I HAVE SET HIM UP WITH APPT FOR 11/28/10.  KLOR-CON, DILTIAZEM, CRESTOR AND ZETIA.   ALL GENERIC MEDS PLEASE WROTE OUT ON PAPER SO HE CAN REFILL THEM AS HE GETS THE MONEY TO DO SO.

## 2010-10-26 NOTE — Consult Note (Signed)
NAMEWALEED, DETTMAN NO.:  [id number]   MEDICAL RECORD NO.:  [id number]          PATIENT TYPE:  INP   LOCATION:  A202                          FACILITY:  [facility]   PHYSICIAN:  Gerrit Friends. Dietrich Pates, MD, FACCDATE OF BIRTH:  [birth year]   DATE OF CONSULTATION:  DATE OF DISCHARGE:                                 CONSULTATION   REFERRING PHYSICIAN:  Dr. Renard Matter.   PRIMARY CARDIOLOGIST:  Previously Dr. Dorethea Clan.   HISTORY OF PRESENT ILLNESS:  A 70 year old gentleman with history of  paroxysmal atrial fibrillation and chronic lung disease with cor  pulmonale presents with chest discomfort.  Mr. Mezera cardiac history  dates to 2004, when he was seen with dyspnea and apparently paroxysmal  atrial fibrillation.  He underwent cardiac catheterization which  revealed elevated right-sided pressures, normal left ventricular  function and no significant coronary disease.  He has done well since  that time, maintained on chronic anticoagulation, until the day of  admission when he noted sharp mid-substernal pleuritic chest pain of  moderate intensity.  There was no radiation.  There was no associated  dyspnea, diaphoresis nor nausea.  He thought this might have been an  exacerbation of his lung disease, prompting him to increase his use of  inhalers.  This did not provide any relief.  As a result, he came to the  emergency department where there was improvement with sublingual  nitroglycerin.   Cardiac markers demonstrate fairly substantial elevations of total CPK,  up to a maximum in excess of 1,100 with normal MB fraction and normal  troponins.  EKG shows inferolateral T-wave inversions, which are  actually less impressive when compared to a prior tracing obtained in  August2004.  A D-dimer was normal.  Other laboratories were  unremarkable, except for a potassium of 2.8.  INR was therapeutic.  Chest x-ray is unremarkable.   PAST MEDICAL HISTORY:  Is otherwise notable for  hypertension, which has  been well-controlled.  The patient was a cigarette smoker remotely, but  not for decades.  He also has a history of GERD and perhaps some  diastolic congestive heart failure for which he takes diuretics.   MOST RECENT MEDICATIONS:  Include:  1. Flovent 240 mcg b.i.d.  2. Diovan HCT 160/25 mg daily.  3. Singulair 10 mg daily.  4. Protonix 40 mg b.i.d.  5. Diltiazem 240 mg daily.  6. KCl 20 mEq daily.  7. Atorvastatin 40 mg daily.  8. Warfarin as directed.  9. Naproxen 500 mg b.i.d.  10.Foradil 1 inhalation b.i.d.  11.Spiriva 1 inhalation daily.  12.Albuterol as used p.r.n.   ALLERGIES:  THE PATIENT HAS NO KNOWN ALLERGIES.   SOCIAL HISTORY:  No excessive use of alcohol; married and lives locally.  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   FAMILY HISTORY:  Positive for coronary disease and hypertension.   REVIEW OF SYSTEMS:  Notable for remote appendectomy, a need for  corrective lenses for myopia, lower dentures and stable weight and  appetite.  All other systems reviewed and are negative.   PHYSICAL EXAMINATION:  GENERAL:  Pleasant gentleman, in no acute  distress.  VITAL SIGNS:  The temperature is 97.7, blood pressure 135/80, heart rate  75 and regular, respirations 20, O2 saturation 97%.  HEENT:  Anicteric  sclerae; normal lids and conjunctiva.  NECK:  No jugular venous distension; normal carotid upstrokes without  bruits.  ENDOCRINE:  No thyromegaly.  HEMATOPOIETIC:  No adenopathy.  SKIN:  No significant lesions.  LUNGS:  Somewhat decreased air movement; slightly prolonged expiratory  phase; no active wheezing, rhonchi nor rales.  CARDIAC:  Normal first and second heart sounds; fourth heart sound  present; normal PMI.  THORAX:  Increased AP diameter.  ABDOMEN:  Soft and nontender; normal bowel sounds; no masses; no  organomegaly.  EXTREMITIES:  Trace edema; normal distal pulses.  NEUROMUSCULAR:  Symmetric strength and tone;  normal cranial nerves.  MUSCULOSKELETAL:  No joint deformities.   EKG:  Normal sinus rhythm; borderline first-degree AV block; shallow T-  wave inversions in an apical pattern.   IMPRESSION:  Mr. Tortorella presents with atypical chest discomfort, and  negative coronary angiography 3 years ago and ST - T-wave abnormalities  that are known and not to be new.  His fairly profound hypokalemia could  account for elevated CPK and EKG abnormalities.  A TSH level will be  checked to be sure that he is euthyroid.  His likelihood of pulmonary  embolism is remote in the setting of anticoagulation a negative D-dimer.  This certainly could have represented an exacerbation of his chronic  brunt asthmatic bronchitis or symptoms referable to the chest wall.  To  be on the safe side, a stress test will be obtained, but this need not  be done while the patient is in hospital.  We will administer potassium,  resume his usual medications, stop beta blocker, stop full  anticoagulation and administer adequate doses of potassium replacement.  Once he is discharged from hospital, medications will be adjusted.  He  likely does not need 2 diuretics or the fairly high doses of furosemide  that he has been taking.  If no atrial fibrillation has been documented  in the past 3 years, it may be appropriate to discontinue chronic  anticoagulation.  This decision will also be made as an outpatient.  We  greatly appreciate the opportunity to become reinvolved in Mr. Derks  care.      Gerrit Friends. Dietrich Pates, MD, [facility]  Electronically Signed     RMR/MEDQ  D:  05/19/2006  T:  05/19/2006  Job:  [id number]

## 2010-10-26 NOTE — H&P (Signed)
[name], GLASHEEN NO.:  [id number]   MEDICAL RECORD NO.:  [id number]                   PATIENT TYPE:  INP   LOCATION:  3715                                 FACILITY:  [facility]   PHYSICIAN:  Rollene Rotunda, M.D.                DATE OF BIRTH:  1941/03/14   DATE OF ADMISSION:  12/31/2002  DATE OF DISCHARGE:                                HISTORY & PHYSICAL   PRIMARY CARE PHYSICIAN:  Angus G. Renard Matter, M.D.   REASON FOR PRESENTATION:  Shortness of breath.   HISTORY OF PRESENT ILLNESS:  The patient is a pleasant, 70 year old, African  American gentleman with a history of cardiac catheterization nine years ago.  At that time, he apparently presented with similar symptom through the  night, which predominantly included shortness of breath.  He tells me that  he had normal coronaries at that time.  He was diagnosed with asthma.   Today after eating, he sat in a chair.  He developed some slight neck  stiffness and chest discomfort.  He developed shortness of breath.  He  thought that he was having an asthma attack and indigestion.  He had not had  an asthma attack since his diagnosis, however.  He took his bronchodilators  without improvement.  He presented to the [facility] Room.  He was  noted to be hypertensive with a blood pressure of 171/97 and a heart rate of  108.  His EKG demonstrated T-wave inversions in V3-4, 2, 3, and aVF.  There  was no baseline for comparison.  He was noted on chest x-ray to have  cardiomegaly with a portable film and some pulmonary edema.  His symptoms  eventually resolved after treatment with three sublingual nitroglycerin and  Lasix.  He continued to have some lingering indigestion-type pain, however.  This has not gone away with Mylanta.  He is now breathing almost back to  baseline, however.  He is clearly in no distress.   In retrospect, the patient says that he walks on his treadmill four times a  week.  With  this he has not developed any chest discomfort.  At home, he has  no neck discomfort, arm discomfort, activity-induced nausea, vomiting, or  excessive diaphoresis.  He has not had any palpitations, presyncope, or  syncope.   PAST MEDICAL HISTORY:  1. Hypertension x 9 years.  2. Hyperlipidemia x 2 years.  3. Asthma.  4. Gout.   PAST SURGICAL HISTORY:  1. Appendectomy.  2. Colonic polyps resected.   ALLERGIES:  None.   MEDICATIONS:  1. Uniphyl 600 mg daily.  2. Clarinex 5 mg daily.  3. Diovan 160 mg daily.  4. Lipitor 15 mg daily.  5. Singulair 10 mg daily.  6. Naprosyn 500 mg b.i.d.  7. Allopurinol 100 mg daily.  8. Toradol twice daily.  9. Flovent 110 mcg two puffs  twice daily.  10.      Albuterol 90 mcg two puffs q.4h. as needed.   SOCIAL HISTORY:  The patient is married.  He quit smoking in the 1980s after  smoking one-and-a-half to one pack per day for 25 years.  He works in Engineer, civil (consulting).   FAMILY HISTORY:  Contributory for his brother having a myocardial infarction  in his 73s.   REVIEW OF SYSTEMS:  Positive for occasional orthopnea.  Positive for mild  lower extremity swelling.  Negative for all other systems.   PHYSICAL EXAMINATION:  GENERAL APPEARANCE:  The patient is in no distress.  VITAL SIGNS:  Blood pressure 137/60, heart rate 80 and regular.  HEENT:  Eyelids unremarkable.  Pupils equal, round, and reactive to light.  Fundi not visualized.  Oral mucosa unremarkable.  NECK:  8 cm jugular venous distention at 9 degrees.  Carotid upstrokes brisk  and symmetric.  No bruits or thyromegaly.  LYMPHATICS:  No cervical, axillary, or inguinal adenopathy.  LUNGS:  Clear to auscultation bilaterally without wheezing or crackles.  BACK:  No costovertebral angle tenderness.  CHEST:  Unremarkable.  HEART:  PMI not displaced or sustained.  S1 and S2 within normal limits.  No  S3.  Positive S4.  No murmurs.  ABDOMEN:  Obese.  Positive bowel sounds normal in frequency  and pitch.  No  bruits, rebound, guarding, or midline pulsatile mass.  No organomegaly.  SKIN:  No rash or nodules.  EXTREMITIES:  2+ pulses throughout.  No edema.  No cyanosis.  No clubbing.  NEUROLOGIC:  Oriented to person, place, and time.  Cranial nerves II-XII  grossly intact.  Motor grossly intact.   LABORATORY DATA:  Portable chest x-ray with mild cardiomegaly and mild  interstitial edema.   WBC 18.1, hemoglobin 13.5, platelets 282.  AST 47, ALT 38, alkaline  phosphatase 55, total bilirubin 0.5.  Sodium 139, potassium 3.4, chloride  105, glucose 124, BUN 22, creatinine 1.7.  CK 1254, MB 6.3.  INR 1.0.   EKG with sinus tachycardia, axis within normal limits, intervals within  normal limits, PVCs, T-wave inversions in 2, 3, aVF, and V4-6 with probable  LVH and strain pattern.   ASSESSMENT AND PLAN:  1. Shortness of breath.  The patient's shortness of breath is worrisome for     a cardiac etiology.  There was some pulmonary edema on chest x-ray which     improved with nitrates and diuretics.  At this point, will check a BNP     level and continue to rule out a myocardial infarction.  Will start with     an echocardiogram.  Because of the nature of the symptoms, I do think he     is going to need a right and left heart catheterization.  Will follow     strict I&Os and daily weights.  2. Elevated CK.  Unclear etiology for this.  He is not describing muscle     aches.  He does lift weights.  However, in the meantime will discontinue     the Lipitor.  3. Elevated white blood cell count.  The etiology of this is unclear.  He     has no fevers or systemic signs of infection.  He is not taking systemic     steroids.  We will repeat this.  4.     Asthma.  He will continue his previous medications.  5. Obesity.  Will educate him with diet and weight loss. 6.  Risk reduction.  Will check a lipid profile, TSH, and hemoglobin A1C.                                               Rollene Rotunda, M.D.    JH/MEDQ  D:  12/31/2002  T:  12/31/2002  Job:  [id number]   cc:   Angus G. Renard Matter, M.D.  [address]  [place]  [place] [address]  Fax: [phone]    cc:   Angus G. Renard Matter, M.D.  [address]  [place]  [place] [address]  Fax: [phone]

## 2010-10-26 NOTE — Assessment & Plan Note (Signed)
[facility]                             PULMONARY OFFICE NOTE   NAME:Christian, [name]                            MRN:          [id number]  DATE:05/08/2006                            DOB:          1941-02-17    [name] is a 70 year old African-American male with chronic obstructive  lung disease, asthmatic bronchitis, overall doing fairly well with  minimal respiratory complaints.  The patient continues to have levels of  dyspnea and some degree of cough with thick yellow mucus, but is  improved overall.   MEDICATIONS:  1. Flovent 2 sprays b.i.d. 110 mcg strength.  2. Singulair 10 mg daily.  3. Foradil 1 spray b.i.d.  4. Spiriva daily.   PHYSICAL EXAMINATION:  VITAL SIGNS:  Temperature 98, blood pressure  114/70, pulse 96, saturation 97% on room air.  CHEST:  Distant breath sounds with prolonged expiratory phase.  No  wheeze or rhonchi.  CARDIAC:  A regular rate and rhythm without S3.  Normal S1 and S2.  ABDOMEN:  Soft, nontender.  EXTREMITIES:  No edema or clubbing.  SKIN:  Clear.   IMPRESSION:  Chronic obstructive lung disease, asthmatic bronchitic  components stable at this time.  Plan for the patient to maintain  inhaled medicines as currently dosed, and the patient will receive a flu  vaccine today in the office.     Christian Cradle Delford Field, MD, [facility]  Electronically Signed    PEW/MedQ  DD: 05/08/2006  DT: 05/08/2006  Job #: [id number]

## 2010-10-26 NOTE — Discharge Summary (Signed)
[name], [name]:  [id number]   MEDICAL RECORD NO.:  [id number]                   PATIENT TYPE:  INP   LOCATION:  3715                                 FACILITY:  [facility]   PHYSICIAN:  Rollene Rotunda, M.D.                DATE OF BIRTH:  Oct 13, 1940   DATE OF ADMISSION:  12/31/2002  DATE OF DISCHARGE:  01/03/2003                           DISCHARGE SUMMARY - REFERRING   SUMMARY OF HISTORY:  The patient is a 70 year old African-American who  presented to the emergency room when he developed chest discomfort and  shortness of breath post eating.  He initially thought it was related to his  asthma and indigestion.  However, he did not receive any improvement with  bronchodilators.  Initial presentation at [facility] showed him to  be hypertensive.  He had some T-wave inversion in V4-V6 and 2 and 2 and aVF.  Thus, he was transferred for further evaluation.   PAST MEDICAL HISTORY:  1. His history is notable for hypertension which has been poorly controlled.  2. Polyps.  3. Gout.  4. Asthma.  5. Hyperlipidemia.   LABORATORY DATA:  Serial CKs, MBs and troponins were negative for myocardial  infarction.  BNP was 35.2.  Fasting lipids showed a total cholesterol 160,  triglycerides 41, HDL 53, LDL 93.  TSH 1.186.  Admission sodium 144,  potassium 4.3, BUN 22, creatinine 1.6, potassium 44.  Hemoglobin A1C 6.0.  PT 12.9.  H&H 11.8 and 34.7.  Normal indices.  Platelets 254,000 and WBC  9.7.  CT scan on the 25th showed a small pericardial effusion, mild hilar  adenopathy right greater than left, probable prominent bronchitic changes.  CT of the lower extremities did not show any DVT.  There was no evidence of  pulmonary embolism.  Echocardiogram was performed on July 23.  Showed an EF  of 45%, diffuse hypokinesis, particularly worse in the inferior and  inferoseptal areas.  Aortic valve thickness was slightly increased.  EKG on  admission showed  sinus tachycardia with a ventricular rate of 111, LVH with  probable repolarization changes.  Subsequent EKGs in addition to sinus  tachycardia and normal sinus rhythm showed atrial fibrillation with a rapid  ventricular rate.   HOSPITAL COURSE:  The patient was admitted to 3700 and placed on his home  medications.  Rollene Rotunda, M.D. was worried for a cardiac etiology in  regards to his shortness of breath.  Felt that he should undergo a right and  left heart catheterization.  It was also noted he had elevated wbc's.  However, he remained afebrile.  Overnight he continued to have chest  pressure, particularly with deep inspiration.  He received supplementation  for his hypokalemia.  Catheterization was performed on the evening of July  23 by Veneda Melter, M.D., revealed a 30% LAD, 30% circumflex, and EF was 55%,  noncritical coronary  artery disease.  Continue medical management.  It was  noted that he had moderate pulmonary hypertension.  After sheath removal and  bedrest he was ambulating without difficulty.  The catheterization site was  intact.  Telemetry showed atrial fibrillation with a rapid ventricular rate  and Dr. Ladona Ridgel was called and placed him on IV Cardizem with improvement of  his ventricular response.  He was placed on anticoagulation with the  anticipation of the need for Coumadin.  On further talking with the patient,  the patient stated that occasionally at home he would notice a rapid  heartbeat.  He could not discern frequency or duration.  By July 25 patient  had converted to normal sinus rhythm.  His IV Cardizem was changed to p.o.  However, he continued to have his pleuritic chest discomfort.  With his  onset of atrial fibrillation and history of chest discomfort a spiral CT was  performed to rule out pulmonary embolism and DVT as described previously.  On July 26 after review Rollene Rotunda, M.D. felt that the patient could be  discharged home.  Prior to discharge  Rollene Rotunda, M.D. ordered CBC which  showed an H&H of 13.4 and 40.3, WBC 8.8, platelets 335,000.  A PT/INR was  13.3 and 1.0, respectively.  It is noted that the patient had received his  first dose of Coumadin on July 24 of 5 mg.  He also received a dose of  Coumadin on July 25 of 5 mg.   DISCHARGE DIAGNOSES:  1. Noncardiac chest discomfort of uncertain etiology.  2. Nonobstructive coronary artery disease as described on cardiac     catheterization.  3. Paroxysmal atrial fibrillation which is a new diagnosis.  4. Hypertension, treated.  5. Hyperlipidemia.  6. Asthma.   DISPOSITION:  The patient is discharged home.   DISCHARGE MEDICATIONS:  1. He received new prescriptions for Coumadin 5 mg.  He is instructed to     take one and a half tablets every day between 4-6 p.m.  2. Cardizem CD 180 mg daily.  3. Uniphyl 600 mg daily.  4. Clarinex 5 mg daily.  5. Diovan 160 daily.  6. Protonix 40 mg b.i.d.  7. Allopurinol 100 daily.  8. Lipitor 40 q.h.s.  9. Singulair 10 mg daily.  10.      Foradil b.i.d.  11.      Flovent 110 mcg two puffs b.i.d.  12.      Albuterol 90 mcg two puffs q.4h. as needed.   ACTIVITY:  Not restricted.  He was given permission to return to work and  weight lifting next week.   DIET:  He was advised low salt, fat, cholesterol diet.  He and his wife are  pursuing the [employer].   DISCHARGE INSTRUCTIONS:  If he has any problems with his catheterization  site he was advised to call us.  He is advised no pseudoephedrine products.  He will have a PT/INR on July 29 at 10:15 a.m.  He will follow up with  Vida Roller, M.D. in the [place] office on August 16 at 10:30 a.m.  At  the time of follow-up review of patient's home blood pressures should be  pursued.  If they continue to remain elevated, consider increasing the  Cardizem for blood pressure control.      Joellyn Rued, P.A. [facility]                    Rollene Rotunda, M.D.   EW/MEDQ  D:   01/03/2003  T:  01/03/2003  Job:  [id number]   cc:   Angus G. Renard Matter, M.D.  [address]  [place]  [place] [address]  Fax: [phone]   Shan Levans, M.D. [facility]   Vida Roller, M.D.  Fax: [phone]

## 2010-10-26 NOTE — Cardiovascular Report (Signed)
NAMEMASAHIRO, IGLESIA                             ACCOUNT NO.:  [id number]   MEDICAL RECORD NO.:  [id number]                   PATIENT TYPE:  INP   LOCATION:  3715                                 FACILITY:  [facility]   PHYSICIAN:  Veneda Melter, M.D.                   DATE OF BIRTH:  09-18-1940   DATE OF PROCEDURE:  12/31/2002  DATE OF DISCHARGE:                              CARDIAC CATHETERIZATION   PROCEDURES PERFORMED:  1. Left heart catheterization.  2. Right heart catheterization.  3. Left ventriculogram.  4. Selective coronary angiography.   DIAGNOSES:  1. Mild coronary artery disease by angiogram.  2. Normal left ventricular size and function.  3. Moderate pulmonary hypertension.   HISTORY:  Mr. Closs is a 70 year old black male with hypertension and reactive  airway disease who presents with shortness of breath and substernal chest  discomfort.  The patient initially thought that this was his asthma and  possibly indigestion, but he had no relief of symptoms with bronchodilators.  He was admitted to the hospital, stabilized medically.  He had nonspecific T-  wave inversion on his ECG and negative cardiac enzymes.  He presents for  further cardiac assessment.   TECHNIQUE:  Informed consent was obtained.  The patient was brought to the  catheterization laboratory.  An 8-French venous and 6-French arterial sheath  were placed in the right groin using the modified Seldinger technique.  A  7.5-French PA catheter was advanced through the pulmonary artery, and  appropriate right-sided hemodynamics were obtained.  A 6-French pigtail  catheter was advanced in the left ventricle and a left ventriculogram  performed after left-sided hemodynamics were obtained.  Subsequently 6-  [place] JL4 and JR4 catheters were then used to engage the left and right  coronary arteries and selective angiography performed in RAO projections  using manual injections of contrast.  At the termination of the  case, the  catheters and sheath were removed and manual pressure applied until adequate  hemostasis was achieved.  The patient tolerated the procedure well and was  transferred to the floor in stable condition.  Findings are as follows.   FINDINGS:   RIGHT HEART CATHETERIZATION:  RA equals 26/23, mean equals 21, RV equals  48/19, PA equals 44/27.  Pulmonary capillary wedge pressures 36/35, mean  equals 30.  There is no significant mitral valve gradient.  Cardiac output  is 8.8 L/min, cardiac index is 5.2 L/min per MSQ by thermodilution method.   LEFT HEART CATHETERIZATION:  1. Left main trunk:  Large-caliber vessel with mild irregularities.  2. LAD:  Medium-caliber vessel that provides three diagonal branches.  The     proximal segment of the LAD has mild irregularities of 20-30%.  3. Left circumflex artery:  This is a medium-caliber vessel that provides 2     marginal branches.  The left circumflex system has a mild irregularity  of     30%.  4. Right coronary:  A dominant, large-caliber vessel that provides the     posterior descending artery and two posterior ventricular branches in     turn. The right coronary has multiple lesions of 30%.  5. LV:  Normal end-systolic and end-diastolic dimensions.  Overall left     ventricular function is well preserved.  Ejection fraction is greater     than 55%.  No mitral regurgitation.  LV pressure is 115/10, aortic is     115/80, LV equals 25.   ASSESSMENT AND PLAN:  Mr. Westenberger is a 70 year old gentleman with moderate  pulmonary hypertension, mild coronary artery disease, and normal left  ventricular function who presents with chest pain and shortness of breath.  The patient will be managed medically with aggressive control of his blood  pressure.                                               Veneda Melter, M.D.    NG/MEDQ  D:  12/31/2002  T:  01/01/2003  Job:  [id number]  Angus G. Renard Matter, M.D.  [address]  [place]  [place] [address]   Fax: [phone]   cc:   Angus G. Renard Matter, M.D.  [address]  [place]  [place] [address]  Fax: [phone]

## 2010-10-26 NOTE — H&P (Signed)
[name], [name]                   ACCOUNT NO.:  [id number]   MEDICAL RECORD NO.:  [id number]          PATIENT TYPE:  INP   LOCATION:  A202                          FACILITY:  [facility]   PHYSICIAN:  Angus G. McInnis, MD   DATE OF BIRTH:  24-Oct-1940   DATE OF ADMISSION:  05/17/2006  DATE OF DISCHARGE:  LH                              HISTORY & PHYSICAL   HISTORY:  [name] is a 70 year old Afro-American male developed  tightness and heaviness in the anterior chest which occurred prior to  his being seen in the ED, this apparently lasted over a period of about  4 hours, he denies any diaphoresis or dyspnea but states that he did  have a feeling of gas in upper abdomen.  He was seen and evaluated by ED  physician.  The patient does have a history of coronary artery disease,  hypertension and atrial fibrillation.  Pertinent laboratory data:  CK-MB  7.6, troponin less than 0.05, myoglobin 423, BNP less than 30,  chemistries within normal limits, prothrombin time 2.8.  Chest x-ray  showed no acute abnormalities.  EKG:  ST depression inferiorly and  laterally.  Chest x-ray:  No acute findings.  The patient was  subsequently admitted.   FAMILY HISTORY:  Positive coronary artery disease and hypertension.   SOCIAL HISTORY:  The patient does not smoke or drink alcohol.   PAST MEDICAL HISTORY:  The patient has history of atrial fibrillation,  hypertension and asthma.   ALLERGIES:  NO KNOWN ALLERGIES.   MEDICATION LIST:  Coumadin.   REVIEW OF SYSTEMS:  HEENT:  Negative.  CARDIOPULMONARY:  The patient has  had anterior chest pain, no cough, minimal dyspnea.  GI:  No nausea or  vomiting but had gaseous discomfort upper abdomen.  GU:  No dysuria or  hematuria.   EXAMINATION:  VITAL SIGNS:  Alert male with blood pressure 145/87,  respirations 22, pulse 84, temperature 97.6.  HEENT:  Eyes:  PERRLA.  TMs negative.  Oropharynx benign.  NECK:  Supple.  No JVD or thyroid abnormalities.  HEART:   Regular rhythm.  No murmurs.  LUNGS:  Clear to P&A.  ABDOMEN:  No palpable organs or masses.  EXTREMITIES:  Free of edema.   DIAGNOSES:  1. Anterior chest pain, possible ischemic heart disease.  2. History of asthma.  3. Hypertension.  4. Atrial fibrillation.      Angus G. Renard Matter, MD  Electronically Signed     AGM/MEDQ  D:  05/18/2006  T:  05/18/2006  Job:  [id number]

## 2010-10-26 NOTE — Assessment & Plan Note (Signed)
 [facility]                               PULMONARY OFFICE NOTE   NAME:Christian, Mittag                            MRN:          [id number]  DATE:02/06/2006                            DOB:          Aug 26, 1940    Mr. Barkan is a 70 year old African American male, history of severe COPD,  continues to have difficulty with the heat and humidity, but otherwise is  maintaining well on Spiriva daily, Foradil 1 spray b.i.d., Flovent 2 sprays  b.i.d. 110 mcg strength, Singulair 10 mg daily, Protonix 40 mg b.i.d.  Other  maintenance medicines are listed in the chart, are correct as reviewed, and  have not changed since the last visit.   PHYSICAL EXAMINATION:  VITAL SIGNS:  The patient's temperature is 97.8,  blood pressure 120/80, pulse 91, saturation 97% on room air.  CHEST:  Showed diminished breath sounds with prolonged expiratory phase.  No  wheeze or rhonchi noted.  CARDIAC:  Exam showed a regular rate and rhythm without S3, normal S1, S2.  ABDOMEN:  Soft, nontender.  EXTREMITIES:  Showed no edema or clubbing.  SKIN:  Clear.  NEUROLOGIC:  Exam was intact.  HEENT:  Exam showed no jugular venous distention, no lymphadenopathy.  Oropharynx was clear, neck supple.   IMPRESSION:  Stable chronic obstructive lung disease, asthmatic bronchitic  components.   PLAN:  Maintain inhaled medicines as currently dosed, and we will see this  patient back in 3 to 4 months.                                   Charlcie Cradle Delford Field, MD, [facility]   PEW/MedQ  DD:  02/06/2006  DT:  02/06/2006  Job #:  [id number]

## 2010-10-26 NOTE — Assessment & Plan Note (Signed)
[facility]                               PULMONARY OFFICE NOTE   NAME:[name], [name]                            MRN:          [id number]  DATE:02/26/2006                            DOB:          [birth year]    [name] returns today in follow up, he is a 70 year old African-American  male with history of chronic obstructive pulmonary disease, asthmatic  bronchitis, now coughing up increased blood for the past week. He took 5  days of Avelox, the blood has cleared, now has had some thin yellow mucus.  He is maintained on Spiriva daily one spray b.i.d., Singulair 10 mg daily,  Flovent two sprays b.i.d. 110 mcg strength.  He is on Coumadin.   PHYSICAL EXAMINATION:  VITAL SIGNS:  Temperature 97.9, blood pressure  120/80, pulse 84, saturation 97% on room air.  CHEST:  Showed diminished breath sounds with prolonged expiratory phase. No  wheeze or rhonchi noted.  CARDIAC EXAM:  Showed a regular rate and rhythm without S3. Normal S1, S2.  ABDOMEN:  Soft, nontender.  EXTREMITIES:  Showed no edema or clubbing.  SKIN:  Clear.   IMPRESSION:  Chronic obstructive pulmonary disease with stable air flow  function.  Mild acute bronchitis.   PLAN:  Maintain inhaled medicines as currently dosed. He will receive  another course of Levaquin at 500 mg daily x5 days. No systemic prednisone  is indicated.  Chest x-ray is noted today to show no active disease process.  We refilled  his albuterol.   We will see the patient back in follow up in 3 months.                                   Charlcie Cradle Delford Field, MD, [facility]   PEW/MedQ  DD:  02/26/2006  DT:  02/27/2006  Job #:  [id number]   cc:   Angus G. Renard Matter, MD

## 2010-10-26 NOTE — Assessment & Plan Note (Signed)
 HEALTHCARE                             PULMONARY OFFICE NOTE   NAME:LEEJessey, Stehlin                          MRN:          [id number]  DATE:07/21/2006                            DOB:          1940-11-05    Mr. Delis returns today in followup. This is a 70 year old African  American male, history of chronic obstructive lung disease, asthmatic  bronchitis, upper respiratory tract symptoms a week ago, coughing up  thick yellow mucus, feeling congested, having some tightness in the  chest, chest pain on the right side. He was hospitalized in December for  chest tightness, found to have a negative cardiac workup. His current  medication program includes;  1. Flovent 2 sprays b.i.d. 220 mcg strength.  2. Diovan 160/25 daily.  3. Singulair 10 mg daily.  4. Protonix 40 mg b.i.d.  5. Diltiazem 240 mg daily.  6. Coumadin daily.  7. Allopurinol daily.  8. Foradil 1 spray b.i.d.  9. Spiriva daily.  10.Lasix 40 mg daily.  11.Lipitor 40 mg daily.   On exam temperature 98, blood pressure 128/76, pulse 104, saturation was  95% room air.  CHEST: Showed distant breath sounds with prolonged expiratory phase, no  wheeze or rhonchi were noted.  CARDIAC EXAM: Showed a regular rate and rhythm without S3, normal S1,  S2.  ABDOMEN: Soft, nontender.  EXTREMITIES: Showed no edema, clubbing, or venous disease.  SKIN: Clear.  NEUROLOGIC EXAM: Intact.  HEENT EXAM: Showed no jugular venous distension, no lymphadenopathy,  oropharynx clear.  NECK: Supple.   IMPRESSION:  Here is that of chronic lung disease with acute bronchitic  flare.   PLAN:  For the patient to receive Avalox 400 mg a day for 5 days, a  predinsone pulse, Tussionex as needed, and return to this office in 6  weeks.     Charlcie Cradle Delford Field, MD, [facility]  Electronically Signed    PEW/MedQ  DD: 07/21/2006  DT: 07/21/2006  Job #: [id number]

## 2010-10-26 NOTE — Group Therapy Note (Signed)
[name], [name]                   ACCOUNT NO.:  [id number]   MEDICAL RECORD NO.:  [id number]          PATIENT TYPE:  INP   LOCATION:  A202                          FACILITY:  [facility]   PHYSICIAN:  Angus G. Renard Matter, MD   DATE OF BIRTH:  [birth year]   DATE OF PROCEDURE:  05/19/2006  DATE OF DISCHARGE:                                 PROGRESS NOTE   This patient was admitted to the hospital with a history of substernal  chest pain.  He did have abnormal cardiac enzymes and hypokalemia.  His  most recent electrolytes show a sodium of 139, potassium 3.5, chloride  106, BUN 18, creatinine 1.3.  Cardiac enzymes initially showed a CK of  1136; most recent 783.  CK-MB 4.5; most recent 3.5.  Troponin initially  at 0.02; now 0.01.  Myoglobin of 423; subsequent 296.  The patient did  have abnormal EKG showing bilateral SVT wave abnormalities.  He has  remained more comfortable.   PHYSICAL EXAMINATION:  VITAL SIGNS:  Blood pressure 125/78, respirations  22, pulse 84, temperature 98.2.  HEART:  Regular rhythm.  LUNGS:  Clear.  ABDOMEN:  No palpable organs or masses.   ASSESSMENT:  The patient was admitted with chest pain.  There is still  some question as to whether or not he has unstable angina.  He does have  a history of previous asthma, previous azotemia.  Plan to obtain  cardiology consult.  Continue current regimen.      Angus G. Renard Matter, MD  Electronically Signed     AGM/MEDQ  D:  05/19/2006  T:  05/19/2006  Job:  [id number]

## 2010-10-26 NOTE — Assessment & Plan Note (Signed)
[facility]                       [facility] CARDIOLOGY OFFICE NOTE   NAME:LEERomen, Yutzy                          MRN:          [id number]  DATE:06/09/2006                            DOB:          1940/12/29    IDENTIFICATION:  [name] is a 70 year old gentleman who was recently  discharged from [facility] where he underwent evaluation for  chest pain.  Note his CK was markedly elevated; MB and troponin, though,  were normal.  BNP normal.  Felt secondary to hypokalemia.   He was seen by Dr. Donnamarie Rossetti on admission, set up for a Myoview scan,  which he has had.  This was on December 17.  This showed inferior  changes that most likely reflect soft tissue attenuation, cannot  completely exclude subendocardial scar, EF of 50% with normal wall  motion.   Since discharge the patient has been breathing better.  He has gotten  over a cold.  He also denies chest pain.   CURRENT MEDICATIONS:  1. Flovent two puffs b.i.d.  2. Diovan hydrochlorothiazide 160/25 daily.  3. Singulair 10 daily.  4. Protonix 40 b.i.d.  5. Diltiazem CD 240 daily.  6. Coumadin as directed.  7. Allopurinol 300 daily.  8. Foradil one puff b.i.d.  9. Spiriva one capsule daily.  10.Lasix 40 daily.  11.Lipitor 40 daily.   PHYSICAL EXAMINATION:  GENERAL:  The patient is in no distress.  VITAL SIGNS:  Blood pressure is 140/76, pulse is 84 and regular, weight  236.  NECK:  JVP is normal, no bruits.  LUNGS:  Slightly decreased airflow, no rales or wheezes.  CARDIAC:  Regular rate, rhythm.  Normal S1, S2.  No S3, no murmurs.  ABDOMEN:  Benign.  EXTREMITIES:  No edema.   IMPRESSION:  1. Chest pain.  Does not appear to be cardiac.  He has gotten better.      Myoview showed no ischemia.  Would continue on current regimen.  2. Hypertension.  Adequate control.  Would continue.  Note he was seen      by Dr. Donnamarie Rossetti in the hospital.  I would not consolidate his  medicines any further.  3. Atrial fibrillation.  The patient's only documented spell was in      2004.  He was admitted initially in sinus rhythm, underwent cardiac      catheterization.  Post catheterization developed atrial      fibrillation, seen by Dr. Lewayne Bunting, placed on Coumadin therapy.      He said he felt the palpitations at that time but has not had any      since.  I will need to review this and discuss with EP.  Dr.      Dietrich Pates had discussed he may come off Coumadin.  Again, his      echocardiogram at that time showed LV EF of 45%, normal wall      thickness, normal left atrium.  I told the patient I would      recommend continuing the Coumadin for now until he heard from me.  4. Dyslipidemia,  on Lipitor.  Need to review when his last lab set      was.   Set followup for later in the spring, sooner if problems develop again.  I will be in touch with him regarding the anticoagulation.     Pricilla Riffle, MD, [facility]  Electronically Signed    PVR/MedQ  DD: 06/09/2006  DT: 06/09/2006  Job #: [id number]   cc:   Angus G. Renard Matter, MD

## 2010-10-26 NOTE — Discharge Summary (Signed)
NAMEOLLIVANDER, SEE                   ACCOUNT NO.:  [id number]   MEDICAL RECORD NO.:  [id number]          PATIENT TYPE:  INP   LOCATION:  A202                          FACILITY:  [facility]   PHYSICIAN:  Angus G. Renard Matter, MD   DATE OF BIRTH:  11/30/1940   DATE OF ADMISSION:  05/17/2006  DATE OF DISCHARGE:  12/10/2007LH                               DISCHARGE SUMMARY   HISTORY:  A 70 year old Afro-American male who was admitted May 18, 2006 and discharged December 10, 207 (one day hospitalization).  Diagnoses for anterior chest pain, possible underlying coronary artery  disease, history of hypertension, atrial fibrillation, bronchial asthma  and hypokalemia.   CONDITION:  Stable at the time of his discharge.   HOSPITAL COURSE:  This 70 year old male undeveloped heaviness in the  anterior chest.  Prior to admission he was seen in the ED , this lasted  over a period of approximately 4 hours.  He 89 denies any diaphoresis or  dyspnea.  He states he did have a feeling of gas in the upper abdomen  was seen and evaluated by ED physician and subsequently admitted.  He  does have a history of coronary artery disease, hypertension, atrial  fibrillation.   OBJECTIVE:  VITA: SN: : Blood pressure 145/87, respiration 22, pulse  84,097.6.  HEENT: Eyes: PERRLA.  TMs negative.  Oropharynx benign.  NECK: Supple.  No JVD or thyroid abnormalities.  HEART:  Regular rhythm  no murmurs.  Lungs: Clear to P&A.  ABDOMEN:  No palpable organs or  masses.  EXTREMITIES:  Free of edema.   LABORATORY DATA:  Admission CBC WBC 9100, hemoglobin 12.8, hematocrit  39.3 a prothrombin time 30.8 seconds, INR 2.8.  Chemistries:  Sodium  136, potassium 2.8, chloride 103, CO2 25, glucose 151, BUN 18,  creatinine 1.6, calcium 8.9, total protein 7.0.  Subsequent Chemistries:  (May 18, 2006) Sodium 139, potassium 3.5, chloride 106, CO2 27,  glucose 108, BUN 18, creatinine 1.3, calcium 8.9.  Liver Enzymes:  SGOT  46, SGPT  35, alkaline phosphatase 81, total bilirubin 0.6.  CK on  admission 1136 with CK-MB 4.5 and troponin 0.02.  Subsequent cardiac  enzymes:  CK 783, CK-MB 3.5, troponin 101.  Chest X-Ray: No acute  abnormalities.  Electrocardiogram normal sinus rhythm, diffuse ST and T-  wave abnormalities.   HOSPITAL COURSE:  The patient , at time of his admission, was placed at  bedrest.  He was started on intravenous fluids at KB 0, [place] standing  orders.  Lovenox 40 mg subcutaneously daily was given.  Dilaudid 1-2 mg  every 4 hours p.r.n. for severe pain, and nitroglycerin 0.4 mg  sublingual as needed for chest pain.  Cardiac enzymes were run, and  except for troponin, CK and CK-MB remained high.  He was started on  metoprolol 50 mg b.i.d.  The patient was noted to be hypokalemic; was  given 3 runs of IV KCL.  This corrected the hypokalemia.   The patient was seen in consultation by cardiology.  They recommend  Spiriva and Flovent.  Dr. Dietrich Pates felt  that he had atypical chest pain.  He had a negative coronary angiography 3 years prior to this, and noted  he had profound  hypokalemia -- which he felt could account for elevated CPK and EKG  abnormalities.  He recommended a stress test be obtained as an  outpatient. He also recommended his full anticoagulation be stopped.  No  atrial fibrillation had been documented for the past 3 years; they felt  that they would see the patient as an outpatient for follow-up.      Angus G. Renard Matter, MD  Electronically Signed     AGM/MEDQ  D:  06/02/2006  T:  06/02/2006  Job:  [id number]

## 2010-10-26 NOTE — Assessment & Plan Note (Signed)
[facility]                             PULMONARY OFFICE NOTE   NAME:LEEBeaux, Wedemeyer                          MRN:          [id number]  DATE:09/01/3006                            DOB:          [birth year]    Mr. Tisdel returns today in followup with underlying chronic obstructive  lung disease, asthmatic bronchitis.  Overall, he is improved with  decreased shortness of breath, decreased cough.  His symptoms are  improved compared to his visit in February, when he received a pulse  prednisone and Avelox prescription.   He maintains:  1. Flovent 2 sprays b.i.d. 220 mcg strength.  2. Foradil 1 spray b.i.d.  3. Spiriva daily.  4. Protonix 40 mg b.i.d.  5. Singulair daily.  All her meds are the same.   PHYSICAL EXAMINATION:  VITAL SIGNS:  Temp was 97, blood pressure 124/80,  pulse 88, saturation 97% room air.  CHEST:  Showed distant breath sounds without evidence of wheeze or  rhonchi.  CARDIAC:  Showed a regular rate and rhythm without S3, normal S1 S2.  ABDOMEN:  Soft, nontender.  EXTREMITIES:  Showed no edema or clubbing.  SKIN:  Clear.   IMPRESSION:  Asthmatic bronchitis with stable airflow function.   PLAN:  Maintain inhaled medicines as currently dosed.  We will see the  patient back in return followup in 3 months.     Charlcie Cradle Delford Field, MD, [facility]  Electronically Signed    PEW/MedQ  DD: 09/01/2006  DT: 09/01/2006  Job #: [id number]

## 2010-11-19 ENCOUNTER — Encounter: Payer: Self-pay | Admitting: Cardiology

## 2010-11-28 ENCOUNTER — Encounter: Payer: Self-pay | Admitting: Cardiology

## 2010-11-28 ENCOUNTER — Ambulatory Visit (INDEPENDENT_AMBULATORY_CARE_PROVIDER_SITE_OTHER): Payer: BC Managed Care – PPO | Admitting: Cardiology

## 2010-11-28 VITALS — BP 161/93 | HR 95 | Ht 67.0 in | Wt 260.0 lb

## 2010-11-28 DIAGNOSIS — I110 Hypertensive heart disease with heart failure: Secondary | ICD-10-CM

## 2010-11-28 DIAGNOSIS — I509 Heart failure, unspecified: Secondary | ICD-10-CM

## 2010-11-28 DIAGNOSIS — I4891 Unspecified atrial fibrillation: Secondary | ICD-10-CM

## 2010-11-28 DIAGNOSIS — I2510 Atherosclerotic heart disease of native coronary artery without angina pectoris: Secondary | ICD-10-CM

## 2010-11-28 MED ORDER — NITROGLYCERIN 0.4 MG SL SUBL: 0.4000 mg | SUBLINGUAL_TABLET | SUBLINGUAL | Status: DC | PRN

## 2010-11-28 MED ORDER — DILTIAZEM HCL ER COATED BEADS 360 MG PO CP24: 360.0000 mg | ORAL_CAPSULE | Freq: Every day | ORAL | Status: DC

## 2010-11-28 NOTE — Assessment & Plan Note (Signed)
Clinically no events. Continue diltiazem and full dose aspirin.

## 2010-11-28 NOTE — Progress Notes (Signed)
HPI [name] comes in today For the evaluation and management of his nonobstructive coronary artery disease, multiple cardiac risk factors, history of A. Fib.  He denies any angina. His chest does get tight when he has an asthmatic flare. He has a significant problems with his breathing and has to use an inhaler on a regular basis. He also has sleep apnea. He is followed by Dr. Delford Field.  His last hemoglobin A1c was 7.9%. He is on maximum lipid-lowering therapy but I do not have his last numbers. He is overweight. He quit smoking years ago. He checks his blood pressure at home and use runs 140-160. His nitroglycerin is outdated. He does not use it.  He denies any orthopnea, PND, but does have some occasional dependent edema. He denies any palpitations or syncope.    Past Medical History  Diagnosis Date  . HTN (hypertension)   . Asthma   . Gout   . Arthritis     ? type ? RA  . Elevated cholesterol   . Acid reflux   . COPD (chronic obstructive pulmonary disease)   . CAD (coronary artery disease)   . GERD (gastroesophageal reflux disease)   . Atrial fibrillation   . OSA (obstructive sleep apnea)     AHI 130 5/10. CPAP 19  . DM2 (diabetes mellitus, type 2)     Past Surgical History  Procedure Date  . Appendectomy   . Right and left carpel tunnel release   . Fluid of abdomen- removal of cyst 2010     Family History  Problem Relation Age of Onset  . Heart attack Brother   . Emphysema      uncle    History   Social History  . Marital Status: Married    Spouse Name: N/A    Number of Children: N/A  . Years of Education: N/A   Occupational History  . Not on file.   Social History Main Topics  . Smoking status: Former Games developer  . Smokeless tobacco: Not on file   Comment: quit in 1981. 1/2 ppd x 20 years  . Alcohol Use: Not on file  . Drug Use: Not on file  . Sexually Active: Not on file   Other Topics Concern  . Not on file   Social History Narrative   Married - lives  with wife. Children, 1 dog. Employed at [employer].     No Known Allergies  Current Outpatient Prescriptions  Medication Sig Dispense Refill  . albuterol (PROAIR HFA) 108 (90 BASE) MCG/ACT inhaler Inhale 2 puffs into the lungs every 6 (six) hours as needed.        Marland Kitchen albuterol (PROVENTIL) (2.5 MG/3ML) 0.083% nebulizer solution Take 2.5 mg by nebulization 3 (three) times daily.        Marland Kitchen aspirin 325 MG tablet Take 325 mg by mouth daily.        Marland Kitchen Dextromethorphan-Guaifenesin (MUCINEX DM MAXIMUM STRENGTH) 60-1200 MG per 12 hr tablet Take 1 tablet by mouth 2 (two) times daily as needed.        . diclofenac (VOLTAREN) 75 MG EC tablet Take 75 mg by mouth 2 (two) times daily.        Marland Kitchen diltiazem (CARDIZEM CD) 240 MG 24 hr capsule Take 1 capsule (240 mg total) by mouth daily.  90 capsule  0  . docusate sodium (COLACE) 100 MG capsule Take 100 mg by mouth daily.        Marland Kitchen ezetimibe (ZETIA) 10 MG tablet  Take 1 tablet (10 mg total) by mouth daily.  90 tablet  0  . furosemide (LASIX) 40 MG tablet Take 1 tablet (40 mg total) by mouth 2 (two) times daily.  180 tablet  0  . ipratropium (ATROVENT) 0.03 % nasal spray Place 2 sprays into the nose 3 (three) times daily.        Marland Kitchen lisinopril (PRINIVIL,ZESTRIL) 10 MG tablet Take 1 tablet (10 mg total) by mouth daily.  90 tablet  0  . nitroGLYCERIN (NITROSTAT) 0.4 MG SL tablet Place 1 tablet (0.4 mg total) under the tongue every 5 (five) minutes as needed for chest pain.  25 tablet  3  . NON FORMULARY CPAP + 14 CM, full face mask       . pantoprazole (PROTONIX) 40 MG tablet Take 40 mg by mouth 2 (two) times daily.        . potassium chloride SA (K-DUR,KLOR-CON) 20 MEQ tablet Take 1 tablet (20 mEq total) by mouth 2 (two) times daily.  180 tablet  0  . RELION PEN NEEDLE 31G/8MM 31G X 8 MM MISC       . rosuvastatin (CRESTOR) 40 MG tablet Take 1 tablet (40 mg total) by mouth daily.  90 tablet  0  . SINGULAIR 10 MG tablet TAKE ONE TABLET BY MOUTH EVERY DAY  30 each  11  .  VICTOZA 18 MG/3ML SOLN 1 mg daily.       Marland Kitchen DISCONTD: Febuxostat (ULORIC) 80 MG TABS Take 1 tablet by mouth daily.        Marland Kitchen DISCONTD: insulin aspart (NOVOLOG FLEXPEN) 100 UNIT/ML injection Inject into the skin. If CBG is over 150.       Marland Kitchen DISCONTD: insulin detemir (LEVEMIR) 100 UNIT/ML injection Inject 25 Units into the skin daily.        Marland Kitchen DISCONTD: valsartan (DIOVAN) 320 MG tablet Take 1 tablet (320 mg total) by mouth daily.  90 tablet  0    ROS Negative other than HPI.   PE General Appearance: well developed, well nourished in no acute distress, obese HEENT: symmetrical face, PERRLA, good dentition  Neck: no JVD, thyromegaly, or adenopathy, trachea midline Chest: symmetric without deformity Cardiac: PMI non-displaced, RRR, normal S1, S2, no gallop or murmur Lung: clear to ausculation and percussion Vascular: all pulses full without bruits  Abdominal: nondistended, nontender, good bowel sounds, no HSM, no bruits Extremities: no cyanosis, clubbing, Mild edema in the lower 70s, no sign of DVT, no varicosities  Skin: normal color, no rashes Neuro: alert and oriented x 3, non-focal Pysch: normal affect  Filed Vitals:   11/28/10 0927  BP: 161/93  Pulse: 95  Height: 5\' 7"  (1.702 m)  Weight: 260 lb (117.935 kg)  SpO2: 92%    EKG  Labs and Studies Reviewed.   Lab Results  Component Value Date   WBC 9.9 07/28/2008   HGB 13.2 10/25/2008   HCT 40.5 10/25/2008   MCV 86.3 07/28/2008   PLT 246 07/28/2008      Chemistry      Component Value Date/Time   NA 143 10/25/2008 1230   K 4.3 10/25/2008 1230   CL 106 10/25/2008 1230   CO2 31 10/25/2008 1230   BUN 21 10/25/2008 1230   CREATININE 1.48 10/25/2008 1230      Component Value Date/Time   CALCIUM 9.1 10/25/2008 1230       No results found for this basename: CHOL   No results found for this basename: HDL  No results found for this basename: LDLCALC   No results found for this basename: TRIG   No results found for this  basename: CHOLHDL   No results found for this basename: HGBA1C   No results found for this basename: ALT, AST, GGT, ALKPHOS, BILITOT   No results found for this basename: TSH

## 2010-11-28 NOTE — Assessment & Plan Note (Signed)
Clinically stable. He needs to work on his blood pressure, weight, hemoglobin A1c in sugar control, and continual maximum lipid-lowering therapy. I reviewed length the importance of achieving goal value to decrease his risk of microvascular as well as macrovascular disease progression. Guidelines and diet given. I've also increased diltiazem to 360 mg a day. He will monitor his blood pressure. He is also on by nephrology. We also did his nitroglycerin with instructions on its use. Will see back in the office in a year.

## 2010-11-28 NOTE — Patient Instructions (Signed)
**Note De-Identified Tori Cupps Obfuscation** Your physician has recommended you make the following change in your medication: increase Diltiazem to 360mg  daily.  Your physician recommends that you start a low sodium diet, please see handout given to you today.  Your physician encouraged you to lose weight for better health.  Your physician has requested that you regularly monitor your blood pressure readings at home. Please use the same machine at the same time of day to check your readings. Your ideal blood pressure is below 140/90.  Your physician recommends that you schedule a follow-up appointment in: 1 year

## 2010-11-30 IMAGING — CR DG CHEST 2V
2 series · 2 of 2 positions shown · non-contrast
Comparison: Chest 11/02/2006.

CLINICAL DATA: COPD.  Cough.

CHEST - 2 VIEW

[view not recorded (1 of 2)]
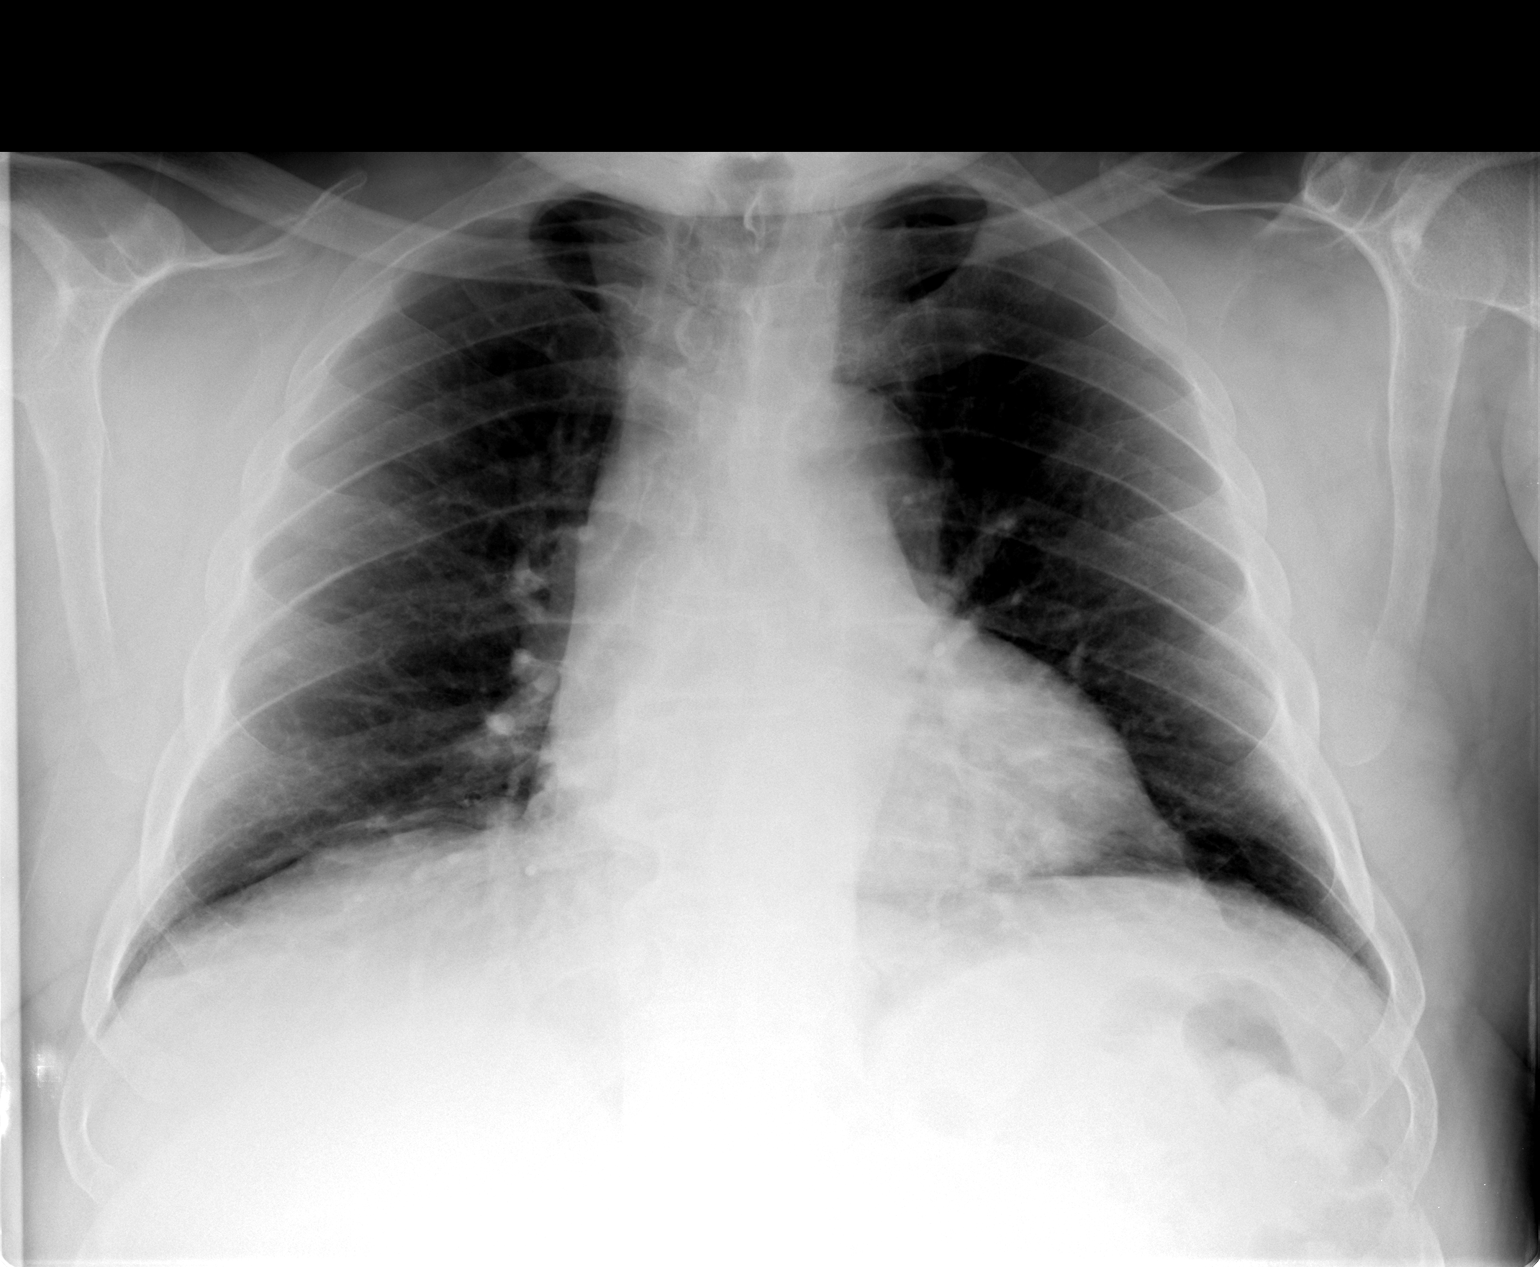

[view not recorded (2 of 2)]
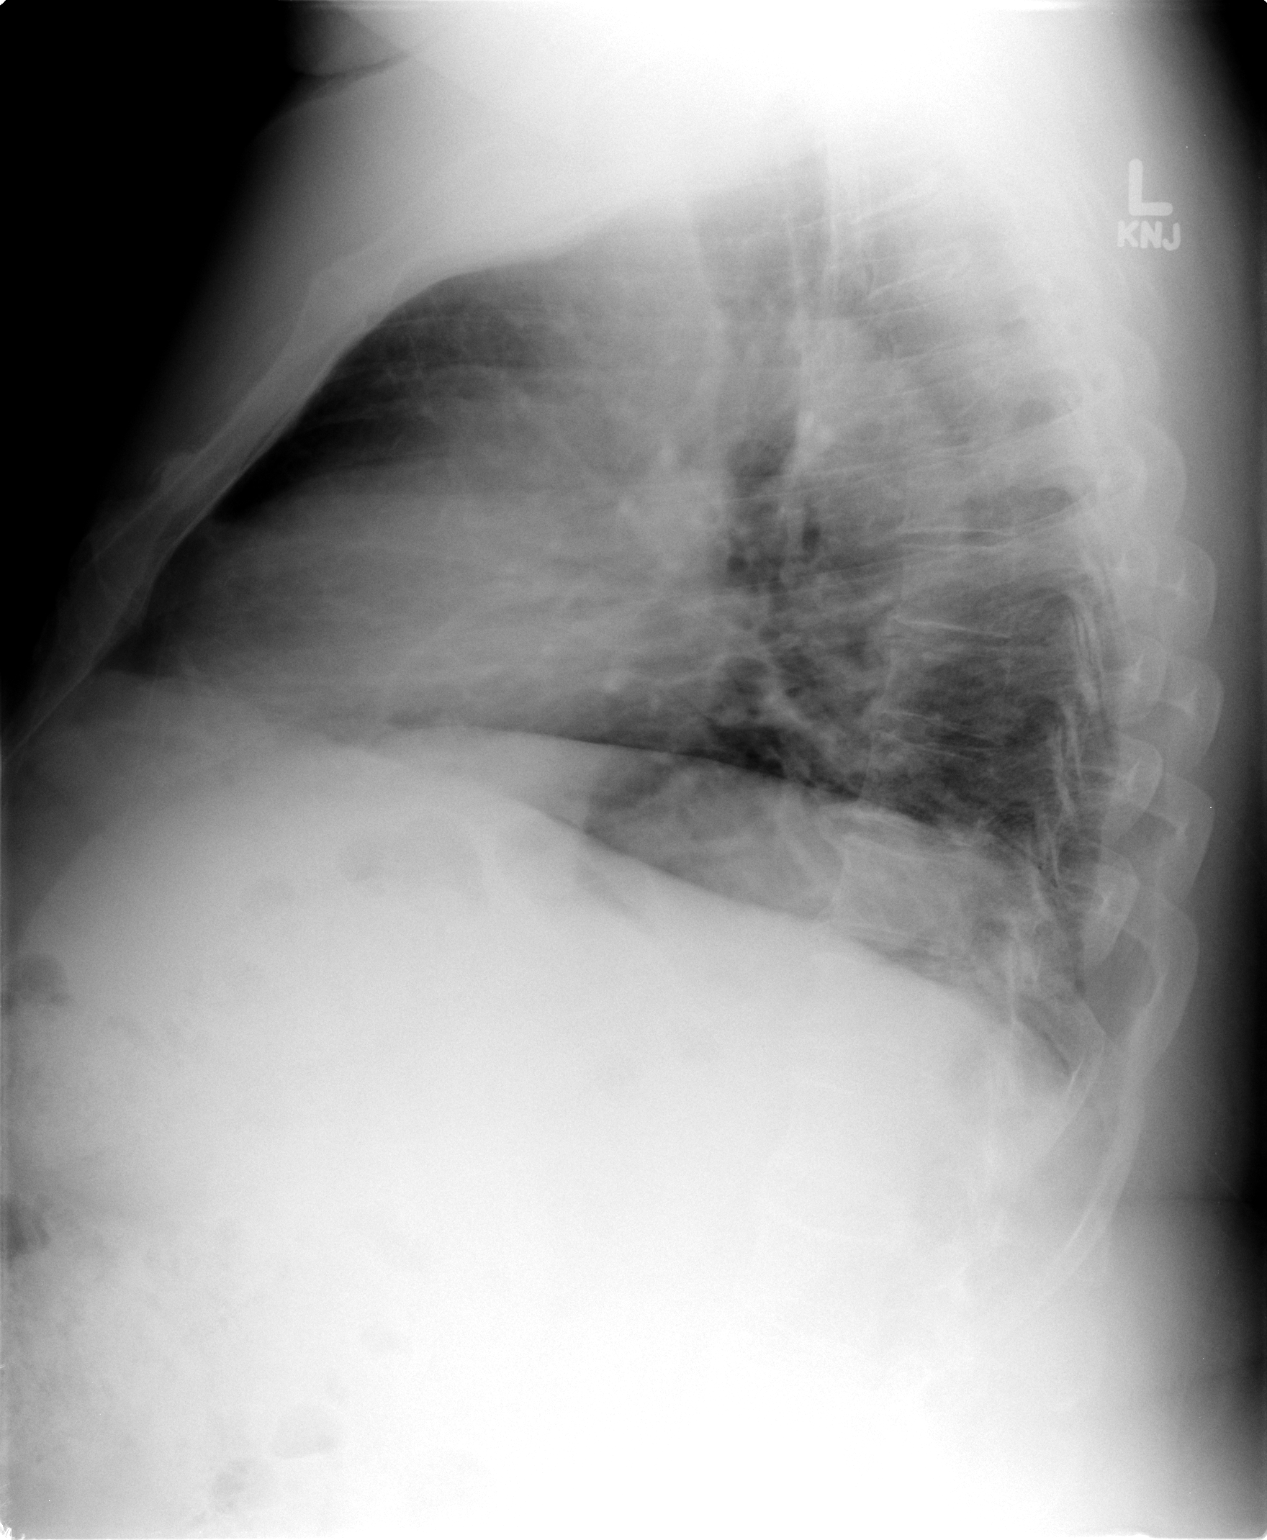

[2 of 2 positions shown; findings below may reference images not displayed]

FINDINGS: There is some linear atelectasis or scarring in the lung
bases.  The lungs are otherwise clear.  No effusion.  Heart size
upper normal.
IMPRESSION: No acute finding.  Mild atelectasis or scarring in the lung bases
noted.

## 2011-02-13 ENCOUNTER — Other Ambulatory Visit: Payer: Self-pay | Admitting: Adult Health

## 2011-03-07 LAB — BASIC METABOLIC PANEL
BUN: 21
Chloride: 106
Glucose, Bld: 115 — ABNORMAL HIGH
Potassium: 3.7

## 2011-03-08 LAB — BASIC METABOLIC PANEL
CO2: 28
Glucose, Bld: 110 — ABNORMAL HIGH
Potassium: 3.9
Sodium: 142

## 2011-07-12 ENCOUNTER — Other Ambulatory Visit: Payer: Self-pay | Admitting: Cardiology

## 2011-07-17 ENCOUNTER — Telehealth: Payer: Self-pay | Admitting: *Deleted

## 2011-07-17 ENCOUNTER — Encounter: Payer: Self-pay | Admitting: Critical Care Medicine

## 2011-07-17 ENCOUNTER — Ambulatory Visit (INDEPENDENT_AMBULATORY_CARE_PROVIDER_SITE_OTHER): Payer: BC Managed Care – PPO | Admitting: Critical Care Medicine

## 2011-07-17 DIAGNOSIS — J449 Chronic obstructive pulmonary disease, unspecified: Secondary | ICD-10-CM

## 2011-07-17 MED ORDER — ALBUTEROL SULFATE HFA 108 (90 BASE) MCG/ACT IN AERS: 1.0000 | INHALATION_SPRAY | Freq: Two times a day (BID) | RESPIRATORY_TRACT | Status: DC

## 2011-07-17 MED ORDER — FLUTICASONE PROPIONATE HFA 220 MCG/ACT IN AERO: 2.0000 | INHALATION_SPRAY | Freq: Two times a day (BID) | RESPIRATORY_TRACT | Status: DC

## 2011-07-17 MED ORDER — MONTELUKAST SODIUM 10 MG PO TABS: 10.0000 mg | ORAL_TABLET | Freq: Every day | ORAL | Status: DC

## 2011-07-17 MED ORDER — ALBUTEROL SULFATE (2.5 MG/3ML) 0.083% IN NEBU: 2.5000 mg | INHALATION_SOLUTION | Freq: Three times a day (TID) | RESPIRATORY_TRACT | Status: DC

## 2011-07-17 NOTE — Assessment & Plan Note (Signed)
Chronic obstructive lung disease with asthmatic bronchitic component stable at this time Plan Maintain inhaled medications as prescribed Get Singulair refilled for allergic rhinitis and asthma  Component Rov 6 months

## 2011-07-17 NOTE — Progress Notes (Signed)
Subjective:    Patient ID: [name], male    DOB: 07/02/1940, 71 y.o.   MRN: [id number]  HPI 68/M with chronic obstructive lung disease & severe obstructive sleep apnea .    07/17/2011 Not seen since 9/11 Prior Copd and OSA on cpap.   No new issues.   Needs ICS/singulair refilled  Past Medical History  Diagnosis Date  . HTN (hypertension)   . Asthma   . Gout   . Arthritis     ? type ? RA  . Elevated cholesterol   . Acid reflux   . COPD (chronic obstructive pulmonary disease)   . CAD (coronary artery disease)   . GERD (gastroesophageal reflux disease)   . Atrial fibrillation   . OSA (obstructive sleep apnea)     AHI 130 5/10. CPAP 19  . DM2 (diabetes mellitus, type 2)      Family History  Problem Relation Age of Onset  . Heart attack Brother   . Emphysema      uncle     History   Social History  . Marital Status: Married    Spouse Name: N/A    Number of Children: N/A  . Years of Education: N/A   Occupational History  . Not on file.   Social History Main Topics  . Smoking status: Former Smoker -- 1.5 packs/day for 21 years    Types: Cigarettes    Quit date: 06/10/1978  . Smokeless tobacco: Never Used  . Alcohol Use: Not on file  . Drug Use: Not on file  . Sexually Active: Not on file   Other Topics Concern  . Not on file   Social History Narrative   Married - lives with wife. Children, 1 dog. Employed at [employer].      No Known Allergies   Outpatient Prescriptions Prior to Visit  Medication Sig Dispense Refill  . aspirin 325 MG tablet Take 325 mg by mouth daily.        Marland Kitchen Dextromethorphan-Guaifenesin (MUCINEX DM MAXIMUM STRENGTH) 60-1200 MG per 12 hr tablet Take 1 tablet by mouth 2 (two) times daily as needed.        . diclofenac (VOLTAREN) 75 MG EC tablet Take 75 mg by mouth 2 (two) times daily.        Marland Kitchen diltiazem (CARDIZEM CD) 360 MG 24 hr capsule Take 1 capsule (360 mg total) by mouth daily.  90 capsule  3  . docusate sodium (COLACE) 100 MG  capsule Take 100 mg by mouth daily.        Marland Kitchen ezetimibe (ZETIA) 10 MG tablet Take 1 tablet (10 mg total) by mouth daily.  90 tablet  0  . furosemide (LASIX) 40 MG tablet Take 1 tablet (40 mg total) by mouth 2 (two) times daily.  180 tablet  0  . KLOR-CON M20 20 MEQ tablet TAKE ONE TABLET BY MOUTH TWICE DAILY  60 each  5  . lisinopril (PRINIVIL,ZESTRIL) 10 MG tablet Take 1 tablet (10 mg total) by mouth daily.  90 tablet  0  . nitroGLYCERIN (NITROSTAT) 0.4 MG SL tablet Place 1 tablet (0.4 mg total) under the tongue every 5 (five) minutes as needed for chest pain.  25 tablet  3  . NON FORMULARY CPAP + 14 CM, full face mask       . pantoprazole (PROTONIX) 40 MG tablet Take 40 mg by mouth 2 (two) times daily.        Marland Kitchen RELION PEN NEEDLE 31G/8MM 31G  X 8 MM MISC       . rosuvastatin (CRESTOR) 40 MG tablet Take 1 tablet (40 mg total) by mouth daily.  90 tablet  0  . albuterol (PROAIR HFA) 108 (90 BASE) MCG/ACT inhaler Inhale 1-2 puffs into the lungs 2 (two) times daily.       Marland Kitchen albuterol (PROVENTIL) (2.5 MG/3ML) 0.083% nebulizer solution Take 2.5 mg by nebulization 3 (three) times daily.        Marland Kitchen SINGULAIR 10 MG tablet TAKE ONE TABLET BY MOUTH EVERY DAY  30 each  11  . diltiazem (CARDIZEM CD) 240 MG 24 hr capsule TAKE ONE CAPSULE BY MOUTH EVERY DAY  90 capsule  4  . ipratropium (ATROVENT) 0.03 % nasal spray Place 2 sprays into the nose 3 (three) times daily.        Marland Kitchen VICTOZA 18 MG/3ML SOLN daily.          Review of Systems Constitutional:   No  weight loss, night sweats,  Fevers, chills, fatigue, lassitude. HEENT:   No headaches,  Difficulty swallowing,  Tooth/dental problems,  Sore throat,                No sneezing, itching, ear ache, nasal congestion, post nasal drip,   CV:  No chest pain,  Orthopnea, PND, swelling in lower extremities, anasarca, dizziness, palpitations  GI  No heartburn, indigestion, abdominal pain, nausea, vomiting, diarrhea, change in bowel habits, loss of appetite  Resp:  Notes  shortness of breath with exertion  Not at rest.  No excess mucus, no productive cough,  No non-productive cough,  No coughing up of blood.  No change in color of mucus.  No wheezing.  No chest wall deformity  Skin: no rash or lesions.  GU: no dysuria, change in color of urine, no urgency or frequency.  No flank pain.  MS:  No joint pain or swelling.  No decreased range of motion.  No back pain.  Psych:  No change in mood or affect. No depression or anxiety.  No memory loss.     Objective:   Physical Exam  Filed Vitals:   07/17/11 1543  BP: 178/90  Pulse: 83  Temp: 97.8 F (36.6 C)  TempSrc: Oral  Height: 5\' 7"  (1.702 m)  Weight: 268 lb 3.2 oz (121.655 kg)  SpO2: 98%    Gen: Pleasant, well-nourished, in no distress,  normal affect  ENT: No lesions,  mouth clear,  oropharynx clear, no postnasal drip  Neck: No JVD, no TMG, no carotid bruits  Lungs: No use of accessory muscles, no dullness to percussion, distant BS  Cardiovascular: RRR, heart sounds normal, no murmur or gallops, no peripheral edema  Abdomen: soft and NT, no HSM,  BS normal  Musculoskeletal: No deformities, no cyanosis or clubbing  Neuro: alert, non focal  Skin: Warm, no lesions or rashes  No results found.       Assessment & Plan:   COPD Chronic obstructive lung disease with asthmatic bronchitic component stable at this time Plan Maintain inhaled medications as prescribed Get Singulair refilled for allergic rhinitis and asthma  Component Rov 6 months    Updated Medication List Outpatient Encounter Prescriptions as of 07/17/2011  Medication Sig Dispense Refill  . albuterol (PROAIR HFA) 108 (90 BASE) MCG/ACT inhaler Inhale 1-2 puffs into the lungs 2 (two) times daily.  1 Inhaler  6  . albuterol (PROVENTIL) (2.5 MG/3ML) 0.083% nebulizer solution Take 3 mLs (2.5 mg total) by nebulization 3 (three)  times daily.  75 mL  6  . aspirin 325 MG tablet Take 325 mg by mouth daily.        Marland Kitchen  Dextromethorphan-Guaifenesin (MUCINEX DM MAXIMUM STRENGTH) 60-1200 MG per 12 hr tablet Take 1 tablet by mouth 2 (two) times daily as needed.        . diclofenac (VOLTAREN) 75 MG EC tablet Take 75 mg by mouth 2 (two) times daily.        Marland Kitchen diltiazem (CARDIZEM CD) 360 MG 24 hr capsule Take 1 capsule (360 mg total) by mouth daily.  90 capsule  3  . docusate sodium (COLACE) 100 MG capsule Take 100 mg by mouth daily.        Marland Kitchen ezetimibe (ZETIA) 10 MG tablet Take 1 tablet (10 mg total) by mouth daily.  90 tablet  0  . fluticasone (FLOVENT HFA) 220 MCG/ACT inhaler Inhale 2 puffs into the lungs 2 (two) times daily.  1 Inhaler  6  . furosemide (LASIX) 40 MG tablet Take 1 tablet (40 mg total) by mouth 2 (two) times daily.  180 tablet  0  . KLOR-CON M20 20 MEQ tablet TAKE ONE TABLET BY MOUTH TWICE DAILY  60 each  5  . lisinopril (PRINIVIL,ZESTRIL) 10 MG tablet Take 1 tablet (10 mg total) by mouth daily.  90 tablet  0  . montelukast (SINGULAIR) 10 MG tablet Take 1 tablet (10 mg total) by mouth at bedtime.  30 tablet  6  . nitroGLYCERIN (NITROSTAT) 0.4 MG SL tablet Place 1 tablet (0.4 mg total) under the tongue every 5 (five) minutes as needed for chest pain.  25 tablet  3  . NON FORMULARY CPAP + 14 CM, full face mask       . pantoprazole (PROTONIX) 40 MG tablet Take 40 mg by mouth 2 (two) times daily.        Marland Kitchen RELION PEN NEEDLE 31G/8MM 31G X 8 MM MISC       . rosuvastatin (CRESTOR) 40 MG tablet Take 1 tablet (40 mg total) by mouth daily.  90 tablet  0  . DISCONTD: albuterol (PROAIR HFA) 108 (90 BASE) MCG/ACT inhaler Inhale 1-2 puffs into the lungs 2 (two) times daily.       Marland Kitchen DISCONTD: albuterol (PROVENTIL) (2.5 MG/3ML) 0.083% nebulizer solution Take 2.5 mg by nebulization 3 (three) times daily.        Marland Kitchen DISCONTD: fluticasone (FLOVENT HFA) 220 MCG/ACT inhaler Inhale 2 puffs into the lungs as needed.      Marland Kitchen DISCONTD: SINGULAIR 10 MG tablet TAKE ONE TABLET BY MOUTH EVERY DAY  30 each  11  . DISCONTD: diltiazem  (CARDIZEM CD) 240 MG 24 hr capsule TAKE ONE CAPSULE BY MOUTH EVERY DAY  90 capsule  4  . DISCONTD: ipratropium (ATROVENT) 0.03 % nasal spray Place 2 sprays into the nose 3 (three) times daily.        Marland Kitchen DISCONTD: VICTOZA 18 MG/3ML SOLN daily.

## 2011-07-17 NOTE — Patient Instructions (Signed)
No change in medications. Return in        6 months        

## 2011-07-23 NOTE — Telephone Encounter (Signed)
Antoinette with [employer] called to state that pt's MONTELUKAST has been approved beginning date 07-17-11 with no expiration date. [id number]. Hazel Sams

## 2011-07-23 NOTE — Telephone Encounter (Signed)
Called [employer].  Spoke with [employer].  Was advised Montelukast APPROVED 07-17-11 with no expiration date.  Will fax approval letter for this.

## 2011-07-23 NOTE — Telephone Encounter (Signed)
Alveria Apley Of [employer] returned Crystal's call.  Stated that approval letter for generic singulair (montelukast) can not be faxed.  Stated it has all ready been mailed & that pt is aware.  Randa Evens can be reached at [phone].  Antionette Fairy

## 2011-07-23 NOTE — Telephone Encounter (Signed)
Called, spoke with pt's wife, Aram Beecham.  She was informed montelukast was approved.  She verbalized understanding of this and was already aware as insurance co has called.  Nothing further needed at this time.

## 2011-07-23 NOTE — Telephone Encounter (Signed)
[facility] aware of approval.    LMOMTCB to inform pt of approval.

## 2011-07-23 NOTE — Telephone Encounter (Signed)
Returning [name]'s call

## 2011-10-16 ENCOUNTER — Other Ambulatory Visit: Payer: Self-pay | Admitting: Critical Care Medicine

## 2012-01-21 ENCOUNTER — Telehealth: Payer: Self-pay | Admitting: *Deleted

## 2012-01-21 NOTE — Telephone Encounter (Signed)
Wife called and states that he [facility] here forward unless they suggest he see Dr Daleen Squibb in the future.

## 2012-02-05 ENCOUNTER — Ambulatory Visit: Payer: BC Managed Care – PPO | Admitting: Critical Care Medicine

## 2012-02-26 ENCOUNTER — Ambulatory Visit (INDEPENDENT_AMBULATORY_CARE_PROVIDER_SITE_OTHER): Payer: BC Managed Care – PPO | Admitting: Critical Care Medicine

## 2012-02-26 ENCOUNTER — Encounter: Payer: Self-pay | Admitting: Critical Care Medicine

## 2012-02-26 VITALS — BP 130/68 | HR 71 | Temp 98.2°F | Ht 67.0 in | Wt 256.4 lb

## 2012-02-26 DIAGNOSIS — G4710 Hypersomnia, unspecified: Secondary | ICD-10-CM

## 2012-02-26 DIAGNOSIS — G4730 Sleep apnea, unspecified: Secondary | ICD-10-CM

## 2012-02-26 DIAGNOSIS — J449 Chronic obstructive pulmonary disease, unspecified: Secondary | ICD-10-CM

## 2012-02-26 MED ORDER — MOMETASONE FURO-FORMOTEROL FUM 200-5 MCG/ACT IN AERO: 2.0000 | INHALATION_SPRAY | Freq: Two times a day (BID) | RESPIRATORY_TRACT | Status: DC

## 2012-02-26 NOTE — Progress Notes (Signed)
Subjective:    Patient ID: [name], male    DOB: Dec 15, 1940, 71 y.o.   MRN: [id number]  HPI  71 y.o.  with chronic obstructive lung disease Golds C & severe obstructive sleep apnea .    07/2011 Not seen since 9/11 Prior Copd and OSA on cpap.   No new issues.   Needs ICS/singulair refilled  02/26/2012 No changes since 2/13.  Notes mild cough, is productive yellow mucus and thick. This is a change x 1.5 weeks.  No real edema.  No chest pain.   No real heartburn. Pt denies any significant sore throat, nasal congestion or excess secretions, fever, chills, sweats, unintended weight loss, pleurtic or exertional chest pain, orthopnea PND, or leg swelling Pt denies any increase in rescue therapy over baseline, denies waking up needing it or having any early am or nocturnal exacerbations of coughing/wheezing/or dyspnea. Pt also denies any obvious fluctuation in symptoms with  weather or environmental change or other alleviating or aggravating factors   Past Medical History  Diagnosis Date  . HTN (hypertension)   . Asthma   . Gout   . Arthritis     ? type ? RA  . Elevated cholesterol   . Acid reflux   . COPD (chronic obstructive pulmonary disease)   . CAD (coronary artery disease)   . GERD (gastroesophageal reflux disease)   . Atrial fibrillation   . OSA (obstructive sleep apnea)     AHI 130 5/10. CPAP 19  . DM2 (diabetes mellitus, type 2)      Family History  Problem Relation Age of Onset  . Heart attack Brother   . Emphysema      uncle     History   Social History  . Marital Status: Married    Spouse Name: N/A    Number of Children: N/A  . Years of Education: N/A   Occupational History  . Not on file.   Social History Main Topics  . Smoking status: Former Smoker -- 1.5 packs/day for 21 years    Types: Cigarettes    Quit date: 06/10/1978  . Smokeless tobacco: Never Used  . Alcohol Use: Not on file  . Drug Use: Not on file  . Sexually Active: Not on file    Other Topics Concern  . Not on file   Social History Narrative   Married - lives with wife. Children, 1 dog. Employed at [employer].      Allergies  Allergen Reactions  . Lipitor (Atorvastatin)     Muscle pain     Outpatient Prescriptions Prior to Visit  Medication Sig Dispense Refill  . albuterol (PROAIR HFA) 108 (90 BASE) MCG/ACT inhaler Inhale 1-2 puffs into the lungs 2 (two) times daily.  1 Inhaler  6  . Dextromethorphan-Guaifenesin (MUCINEX DM MAXIMUM STRENGTH) 60-1200 MG per 12 hr tablet Take 1 tablet by mouth 2 (two) times daily as needed.        . diclofenac (VOLTAREN) 75 MG EC tablet Take 75 mg by mouth 2 (two) times daily as needed.       . diltiazem (CARDIZEM CD) 360 MG 24 hr capsule Take 1 capsule (360 mg total) by mouth daily.  90 capsule  3  . docusate sodium (COLACE) 100 MG capsule Take 100 mg by mouth daily.        . furosemide (LASIX) 40 MG tablet Take 1 tablet (40 mg total) by mouth 2 (two) times daily.  180 tablet  0  .  KLOR-CON M20 20 MEQ tablet TAKE ONE TABLET BY MOUTH TWICE DAILY  60 each  5  . montelukast (SINGULAIR) 10 MG tablet Take 1 tablet (10 mg total) by mouth at bedtime.  30 tablet  6  . nitroGLYCERIN (NITROSTAT) 0.4 MG SL tablet Place 1 tablet (0.4 mg total) under the tongue every 5 (five) minutes as needed for chest pain.  25 tablet  3  . NON FORMULARY CPAP + 14 CM, full face mask       . pantoprazole (PROTONIX) 40 MG tablet Take 40 mg by mouth daily.       Marland Kitchen RELION PEN NEEDLE 31G/8MM 31G X 8 MM MISC       . rosuvastatin (CRESTOR) 40 MG tablet Take 1 tablet (40 mg total) by mouth daily.  90 tablet  0  . SINGULAIR 10 MG tablet TAKE ONE TABLET BY MOUTH EVERY DAY  30 each  3  . albuterol (PROVENTIL) (2.5 MG/3ML) 0.083% nebulizer solution Take 3 mLs (2.5 mg total) by nebulization 3 (three) times daily.  75 mL  6  . aspirin 325 MG tablet Take 325 mg by mouth daily.        Marland Kitchen ezetimibe (ZETIA) 10 MG tablet Take 1 tablet (10 mg total) by mouth daily.  90 tablet   0  . fluticasone (FLOVENT HFA) 220 MCG/ACT inhaler Inhale 2 puffs into the lungs 2 (two) times daily.  1 Inhaler  6  . lisinopril (PRINIVIL,ZESTRIL) 10 MG tablet Take 1 tablet (10 mg total) by mouth daily.  90 tablet  0     Review of Systems  Constitutional:   No  weight loss, night sweats,  Fevers, chills, fatigue, lassitude. HEENT:   No headaches,  Difficulty swallowing,  Tooth/dental problems,  Sore throat,                No sneezing, itching, ear ache, nasal congestion, post nasal drip,   CV:  No chest pain,  Orthopnea, PND, swelling in lower extremities, anasarca, dizziness, palpitations  GI  No heartburn, indigestion, abdominal pain, nausea, vomiting, diarrhea, change in bowel habits, loss of appetite  Resp: Notes  shortness of breath with exertion  Not at rest.  No excess mucus, no productive cough,  No non-productive cough,  No coughing up of blood.  No change in color of mucus.  No wheezing.  No chest wall deformity  Skin: no rash or lesions.  GU: no dysuria, change in color of urine, no urgency or frequency.  No flank pain.  MS:  No joint pain or swelling.  No decreased range of motion.  No back pain.  Psych:  No change in mood or affect. No depression or anxiety.  No memory loss.     Objective:   Physical Exam   Filed Vitals:   02/26/12 1016  BP: 130/68  Pulse: 71  Temp: 98.2 F (36.8 C)  TempSrc: Oral  Height: 5\' 7"  (1.702 m)  Weight: 256 lb 6.4 oz (116.302 kg)  SpO2: 92%    Gen: Pleasant, well-nourished, in no distress,  normal affect  ENT: No lesions,  mouth clear,  oropharynx clear, no postnasal drip  Neck: No JVD, no TMG, no carotid bruits  Lungs: No use of accessory muscles, no dullness to percussion, distant BS  Cardiovascular: RRR, heart sounds normal, no murmur or gallops, no peripheral edema  Abdomen: soft and NT, no HSM,  BS normal  Musculoskeletal: No deformities, no cyanosis or clubbing  Neuro: alert, non focal  Skin: Warm, no  lesions or rashes  No results found.       Assessment & Plan:   COPD Golds C Copd  Not oxygen dependent Plan Increase dulera to two puff bid D/c flovent  HYPERSOMNIA, ASSOCIATED WITH SLEEP APNEA Severe OSA on cpap 19 full face mask Plan Rx given so pt can obtain [facility]ap [facility]    Updated Medication List Outpatient Encounter Prescriptions as of 02/26/2012  Medication Sig Dispense Refill  . albuterol (PROAIR HFA) 108 (90 BASE) MCG/ACT inhaler Inhale 1-2 puffs into the lungs 2 (two) times daily.  1 Inhaler  6  . albuterol (PROVENTIL) (5 MG/ML) 0.5% nebulizer solution Take 2.5 mg by nebulization daily as needed.      Marland Kitchen allopurinol (ZYLOPRIM) 300 MG tablet Take 300 mg by mouth daily.      . carvedilol (COREG) 25 MG tablet Take 25 mg by mouth 2 (two) times daily.      Marland Kitchen Dextromethorphan-Guaifenesin (MUCINEX DM MAXIMUM STRENGTH) 60-1200 MG per 12 hr tablet Take 1 tablet by mouth 2 (two) times daily as needed.        . diclofenac (VOLTAREN) 75 MG EC tablet Take 75 mg by mouth 2 (two) times daily as needed.       . diltiazem (CARDIZEM CD) 360 MG 24 hr capsule Take 1 capsule (360 mg total) by mouth daily.  90 capsule  3  . docusate sodium (COLACE) 100 MG capsule Take 100 mg by mouth daily.        . furosemide (LASIX) 40 MG tablet Take 1 tablet (40 mg total) by mouth 2 (two) times daily.  180 tablet  0  . glipiZIDE (GLUCOTROL) 5 MG tablet Take 5 mg by mouth daily.      Marland Kitchen KLOR-CON M20 20 MEQ tablet TAKE ONE TABLET BY MOUTH TWICE DAILY  60 each  5  . lisinopril (PRINIVIL,ZESTRIL) 40 MG tablet Take 40 mg by mouth daily.      . Mometasone Furo-Formoterol Fum (DULERA) 200-5 MCG/ACT AERO Inhale 2 puffs into the lungs 2 (two) times daily.      . montelukast (SINGULAIR) 10 MG tablet Take 1 tablet (10 mg total) by mouth at bedtime.  30 tablet  6  . nitroGLYCERIN (NITROSTAT) 0.4 MG SL tablet Place 1 tablet (0.4 mg total) under the tongue every 5 (five) minutes as needed for chest pain.   25 tablet  3  . NON FORMULARY CPAP + 14 CM, full face mask       . pantoprazole (PROTONIX) 40 MG tablet Take 40 mg by mouth daily.       Marland Kitchen RELION PEN NEEDLE 31G/8MM 31G X 8 MM MISC       . rosuvastatin (CRESTOR) 40 MG tablet Take 20 mg by mouth daily.      . traMADol (ULTRAM) 50 MG tablet Take 50 mg by mouth every 6 (six) hours as needed.      . traZODone (DESYREL) 50 MG tablet Take 50 mg by mouth at bedtime as needed.      . warfarin (COUMADIN) 5 MG tablet Take 5 mg by mouth as directed.      Marland Kitchen DISCONTD: Mometasone Furo-Formoterol Fum (DULERA) 200-5 MCG/ACT AERO Inhale 2 puffs into the lungs at bedtime.      Marland Kitchen DISCONTD: rosuvastatin (CRESTOR) 40 MG tablet Take 1 tablet (40 mg total) by mouth daily.  90 tablet  0  . DISCONTD: SINGULAIR 10 MG tablet TAKE ONE TABLET BY MOUTH  EVERY DAY  30 each  3  . albuterol (PROVENTIL) (2.5 MG/3ML) 0.083% nebulizer solution Take 3 mLs (2.5 mg total) by nebulization 3 (three) times daily.  75 mL  6  . DISCONTD: aspirin 325 MG tablet Take 325 mg by mouth daily.        Marland Kitchen DISCONTD: ezetimibe (ZETIA) 10 MG tablet Take 1 tablet (10 mg total) by mouth daily.  90 tablet  0  . DISCONTD: fluticasone (FLOVENT HFA) 220 MCG/ACT inhaler Inhale 2 puffs into the lungs 2 (two) times daily.  1 Inhaler  6  . DISCONTD: lisinopril (PRINIVIL,ZESTRIL) 10 MG tablet Take 1 tablet (10 mg total) by mouth daily.  90 tablet  0

## 2012-02-26 NOTE — Patient Instructions (Addendum)
You are on CPAP 19cmh20.   Increase dulera two puff twice daily Stop flovent Return 6 months

## 2012-02-27 NOTE — Assessment & Plan Note (Signed)
Severe OSA on cpap 19 full face mask Plan Rx given so pt can obtain next cpap machine from [facility]

## 2012-02-27 NOTE — Assessment & Plan Note (Addendum)
Golds C Copd  Not oxygen dependent Plan Increase dulera to two puff bid D/c flovent

## 2012-08-18 ENCOUNTER — Telehealth: Payer: Self-pay | Admitting: Critical Care Medicine

## 2012-08-18 NOTE — Telephone Encounter (Signed)
ATC patient x3. No return call back. Sent letter 08/18/12.

## 2013-04-07 ENCOUNTER — Ambulatory Visit: Payer: BC Managed Care – PPO | Admitting: Orthopedic Surgery

## 2013-04-21 ENCOUNTER — Ambulatory Visit: Payer: BC Managed Care – PPO | Admitting: Orthopedic Surgery

## 2013-05-05 ENCOUNTER — Encounter: Payer: Self-pay | Admitting: Orthopedic Surgery

## 2013-05-05 ENCOUNTER — Ambulatory Visit (INDEPENDENT_AMBULATORY_CARE_PROVIDER_SITE_OTHER): Payer: Medicare Other

## 2013-05-05 ENCOUNTER — Ambulatory Visit (INDEPENDENT_AMBULATORY_CARE_PROVIDER_SITE_OTHER): Payer: Medicare Other | Admitting: Orthopedic Surgery

## 2013-05-05 VITALS — BP 124/80 | Ht 67.0 in | Wt 232.0 lb

## 2013-05-05 DIAGNOSIS — M48061 Spinal stenosis, lumbar region without neurogenic claudication: Secondary | ICD-10-CM

## 2013-05-05 DIAGNOSIS — M25559 Pain in unspecified hip: Secondary | ICD-10-CM

## 2013-05-05 DIAGNOSIS — M1610 Unilateral primary osteoarthritis, unspecified hip: Secondary | ICD-10-CM

## 2013-05-05 DIAGNOSIS — M25551 Pain in right hip: Secondary | ICD-10-CM | POA: Insufficient documentation

## 2013-05-05 DIAGNOSIS — M549 Dorsalgia, unspecified: Secondary | ICD-10-CM

## 2013-05-05 NOTE — Progress Notes (Signed)
Patient ID: [name], male   DOB: 02/20/1941, 72 y.o.   MRN: [id number]  Chief Complaint  Patient presents with  . Hip Pain    Bilateral hip pain  . Back Pain    Lower back pain    HISTORY: This is a 71 year old male (2 was injured back in 48s with a pelvic fracture which she was treated for with a pelvic sling. He returned to [employer] duty. He presents now with hip pain back pain knee pain ankle pain had recent x-ray shows he has bilateral ankle and foot arthritis with tendinopathy of his Achilles tendons and plantar fascia.  He has some lower back pain and pain in his upper left hip and left groin. The pain is dull describes as burning, 8/10 constant. He does have some numbness and tingling in his wife has noticed that when he walks he walks with a flexed lumbar spine at the lumbopelvic junction.  He reports weight gain and watering of his eyes shortness of breath with wheezing and cough and tightness of the chest with snoring. Nausea. Joint and muscle pain. Numbness and tingling of his legs. Nervousness depression he has a history of PTSD cold intolerance and seasonal allergy denies hematological genitourinary or cardiovascular complaints despite having coronary artery disease with atrial fibrillation and on chronic warfarin therapy.  Has a history of COPD did not tolerate his abdominal surgery postoperative course requiring several days in the intensive care unit. He also has some prostatitis hypertension sleep disorder which is probably a sleep apnea situation high cholesterol. Had surgery on his thumb and small finger has had colon cancer surgery with abdominal parotid me. Prostate surgery as well.  Family history heart disease arthritis asthma diabetes  Social history married retired does not smoke or drink   Medications include warfarin, allopurinol, lisinopril 40, amlodipine 10, furosemide 40, potassium 20 pantoprazole 40 diltiazem 240 at her back statin 80 trazodone 100  carvedilol 12.5  BP 124/80  Ht 5\' 7"  (1.702 m)  Wt 232 lb (105.235 kg)  BMI 36.33 kg/m2 Body habitus large Appearance normal Is alert and oriented x3 mood and affect are normal he has a limp and a waddling gait with a flexed lumbosacral posture  Lumbar spine tenderness in his lower back quite severe even more than he expected. Decreased range of motion. No instability. Muscle tone normal. Skin normal.  Upper extremity exam  The right and left upper extremity:   Inspection and palpation revealed no abnormalities in the upper extremities.   Range of motion is without contracture.  Motor exam is normal with grade 5 strength, except for rotator cuff weakness  The joints are fully reduced without subluxation.  There is no atrophy or tremor and muscle tone is normal.  All joints are stable.    Right hip exam no deformity leg lengths are equal he has decreased hip flexion decreased internal rotation pain is relieved with external rotation. Hip is stable motor exam normal skin is intact  Left hip decreased hip flexion less than 90 and full internal rotation and hip flexion decreased internal rotation pain relief with external rotation hip is stable motor exam normal scans intact  Pulses are intact sensation is normal lymph nodes are negative deep tendon reflexes are equal  Balanced hard to assess because he couldn't fully weight-bear on the left hip  X-rays show degenerative disc disease lumbar spine with listhesis previous lumbar spine fracture date unknown  Bilateral hip arthritis  Assessment: The patient has signs  spinal stenosis we'll need MRI to evaluate that. He may need a hip replacement at some point as well and is anesthetic risk place him at severely high risk. He may need to be done a tertiary care facility.  He has not really been treated for spinal stenosis so if his MRI comes back positive but not requiring surgery will institute physical therapy. He will not be a  candidate for NSAIDs because of his heart disease and chronic warfarin therapy and will need to be on tramadol. Tramadol proves unsuccessful then we recommend pain management Center to help with his pain control  Encounter Diagnoses  Name Primary?  . Bilateral hip pain   . Back pain   . Spinal stenosis of lumbar region Yes  . Arthritis, hip

## 2013-05-05 NOTE — Patient Instructions (Signed)
MRI ordered

## 2013-05-15 ENCOUNTER — Ambulatory Visit
Admission: RE | Admit: 2013-05-15 | Discharge: 2013-05-15 | Disposition: A | Discharge status: Home / Self Care | Payer: Medicare Other | Source: Ambulatory Visit | Attending: Orthopedic Surgery | Admitting: Orthopedic Surgery

## 2013-05-15 DIAGNOSIS — M48061 Spinal stenosis, lumbar region without neurogenic claudication: Secondary | ICD-10-CM

## 2013-05-17 ENCOUNTER — Telehealth: Payer: Self-pay | Admitting: Orthopedic Surgery

## 2013-05-17 ENCOUNTER — Other Ambulatory Visit: Payer: Self-pay | Admitting: Orthopedic Surgery

## 2013-05-17 DIAGNOSIS — M48061 Spinal stenosis, lumbar region without neurogenic claudication: Secondary | ICD-10-CM

## 2013-05-17 MED ORDER — TRAMADOL HCL 50 MG PO TABS: 50.0000 mg | ORAL_TABLET | Freq: Four times a day (QID) | ORAL | Status: DC | PRN

## 2013-05-17 NOTE — Telephone Encounter (Signed)
The disc of [name] MRI is in you box.  He said you were going to call him with the results, but the chart notes say return for MRI results. Please advise if to  schedule an appointment

## 2013-05-17 NOTE — Telephone Encounter (Signed)
i ll call him

## 2013-05-17 NOTE — Telephone Encounter (Signed)
Call him 

## 2013-05-18 ENCOUNTER — Encounter: Payer: Self-pay | Admitting: Orthopedic Surgery

## 2013-05-18 NOTE — Progress Notes (Unsigned)
Patient ID: [name], male   DOB: [birth year], 72 y.o.   MRN: [id number]  MRI RESULT REPORT  L3-4: Mild to moderate facet disease but no focal disc protrusions, spinal or foraminal stenosis.   L4-5: No focal disc protrusions, spinal or foraminal stenosis. Moderate facet disease.   L5-S1: Advanced facet disease but no focal disc protrusions, spinal or foraminal stenosis.   IMPRESSION: Moderate to advanced facet disease in the lower lumbar spine but no focal disc protrusions, spinal or foraminal stenosis.

## 2013-05-26 ENCOUNTER — Telehealth: Payer: Self-pay | Admitting: *Deleted

## 2013-05-26 ENCOUNTER — Other Ambulatory Visit: Payer: Self-pay | Admitting: *Deleted

## 2013-05-26 NOTE — Telephone Encounter (Signed)
Received call from patient stating that he is still having ankle and knee pain.He also states that he is taking PT for his back at the [facility] hospital, with no relief. I reviewed his last office note, and advised him of your recommendations for pain management if tramadol proved unsuccessful. The patient states that he is already in pain management at the [facility] hospital also. He did not like what I had to offer, and  is asking if you would please call him directly, and I advised that I would put the note in and ask. His phone number is [phone].

## 2013-06-09 ENCOUNTER — Telehealth: Payer: Self-pay | Admitting: Orthopedic Surgery

## 2013-06-09 NOTE — Telephone Encounter (Signed)
MESSAGE LEFT

## 2013-06-17 ENCOUNTER — Telehealth: Payer: Self-pay | Admitting: Orthopedic Surgery

## 2013-06-17 NOTE — Telephone Encounter (Signed)
[name] called for [name], said you had called and they missed your call.  She asked if you will call back because they really need to speak with  you concerning [name].  Their # [phone]

## 2013-06-21 ENCOUNTER — Encounter: Payer: Self-pay | Admitting: Orthopedic Surgery

## 2013-06-29 NOTE — Telephone Encounter (Signed)
Phone call returned, patient aware to pick up letter from Dr. Romeo AppleHarrison

## 2015-07-19 ENCOUNTER — Ambulatory Visit: Payer: Self-pay | Admitting: Orthopedic Surgery

## 2015-12-10 ENCOUNTER — Encounter (HOSPITAL_COMMUNITY): Payer: Self-pay | Admitting: Emergency Medicine

## 2015-12-10 ENCOUNTER — Emergency Department (HOSPITAL_COMMUNITY)
Admission: EM | Admit: 2015-12-10 | Discharge: 2015-12-10 | Disposition: A | Discharge status: Home / Self Care | Payer: PPO | Attending: Emergency Medicine | Admitting: Emergency Medicine

## 2015-12-10 ENCOUNTER — Emergency Department (HOSPITAL_COMMUNITY): Payer: PPO

## 2015-12-10 DIAGNOSIS — I4891 Unspecified atrial fibrillation: Secondary | ICD-10-CM | POA: Insufficient documentation

## 2015-12-10 DIAGNOSIS — R600 Localized edema: Secondary | ICD-10-CM | POA: Diagnosis not present

## 2015-12-10 DIAGNOSIS — Z87891 Personal history of nicotine dependence: Secondary | ICD-10-CM | POA: Insufficient documentation

## 2015-12-10 DIAGNOSIS — E119 Type 2 diabetes mellitus without complications: Secondary | ICD-10-CM | POA: Insufficient documentation

## 2015-12-10 DIAGNOSIS — I2510 Atherosclerotic heart disease of native coronary artery without angina pectoris: Secondary | ICD-10-CM | POA: Insufficient documentation

## 2015-12-10 DIAGNOSIS — M7989 Other specified soft tissue disorders: Secondary | ICD-10-CM | POA: Diagnosis not present

## 2015-12-10 DIAGNOSIS — R609 Edema, unspecified: Secondary | ICD-10-CM

## 2015-12-10 DIAGNOSIS — I10 Essential (primary) hypertension: Secondary | ICD-10-CM | POA: Diagnosis not present

## 2015-12-10 DIAGNOSIS — J449 Chronic obstructive pulmonary disease, unspecified: Secondary | ICD-10-CM | POA: Insufficient documentation

## 2015-12-10 DIAGNOSIS — K429 Umbilical hernia without obstruction or gangrene: Secondary | ICD-10-CM | POA: Insufficient documentation

## 2015-12-10 DIAGNOSIS — R109 Unspecified abdominal pain: Secondary | ICD-10-CM | POA: Diagnosis not present

## 2015-12-10 DIAGNOSIS — M19072 Primary osteoarthritis, left ankle and foot: Secondary | ICD-10-CM | POA: Diagnosis not present

## 2015-12-10 DIAGNOSIS — J45909 Unspecified asthma, uncomplicated: Secondary | ICD-10-CM | POA: Diagnosis not present

## 2015-12-10 DIAGNOSIS — Z79899 Other long term (current) drug therapy: Secondary | ICD-10-CM | POA: Insufficient documentation

## 2015-12-10 DIAGNOSIS — M1990 Unspecified osteoarthritis, unspecified site: Secondary | ICD-10-CM | POA: Insufficient documentation

## 2015-12-10 LAB — CBC WITH DIFFERENTIAL/PLATELET
Basophils Absolute: 0 10*3/uL (ref 0.0–0.1)
Basophils Relative: 0 %
Eosinophils Absolute: 0.2 10*3/uL (ref 0.0–0.7)
Eosinophils Relative: 2 %
HEMATOCRIT: 40 % (ref 39.0–52.0)
Hemoglobin: 12.7 g/dL — ABNORMAL LOW (ref 13.0–17.0)
LYMPHS PCT: 17 %
Lymphs Abs: 1.9 10*3/uL (ref 0.7–4.0)
MCH: 27.3 pg (ref 26.0–34.0)
MCHC: 31.8 g/dL (ref 30.0–36.0)
MCV: 86 fL (ref 78.0–100.0)
MONO ABS: 0.6 10*3/uL (ref 0.1–1.0)
MONOS PCT: 6 %
NEUTROS ABS: 8.3 10*3/uL — AB (ref 1.7–7.7)
Neutrophils Relative %: 75 %
PLATELETS: 236 10*3/uL (ref 150–400)
RBC: 4.65 MIL/uL (ref 4.22–5.81)
RDW: 16.2 % — AB (ref 11.5–15.5)
WBC: 10.9 10*3/uL — ABNORMAL HIGH (ref 4.0–10.5)

## 2015-12-10 LAB — COMPREHENSIVE METABOLIC PANEL
ALT: 11 U/L — ABNORMAL LOW (ref 17–63)
AST: 17 U/L (ref 15–41)
Albumin: 3.4 g/dL — ABNORMAL LOW (ref 3.5–5.0)
Alkaline Phosphatase: 76 U/L (ref 38–126)
Anion gap: 7 (ref 5–15)
BUN: 14 mg/dL (ref 6–20)
CO2: 27 mmol/L (ref 22–32)
Calcium: 8.4 mg/dL — ABNORMAL LOW (ref 8.9–10.3)
Chloride: 104 mmol/L (ref 101–111)
Creatinine, Ser: 1.6 mg/dL — ABNORMAL HIGH (ref 0.61–1.24)
GFR calc Af Amer: 47 mL/min — ABNORMAL LOW (ref >60)
GFR calc non Af Amer: 41 mL/min — ABNORMAL LOW (ref >60)
GLUCOSE: 226 mg/dL — AB (ref 65–99)
POTASSIUM: 3.6 mmol/L (ref 3.5–5.1)
Sodium: 138 mmol/L (ref 135–145)
Total Bilirubin: 0.8 mg/dL (ref 0.3–1.2)
Total Protein: 6.8 g/dL (ref 6.5–8.1)

## 2015-12-10 MED ORDER — POTASSIUM CHLORIDE CRYS ER 20 MEQ PO TBCR: 20.0000 meq | EXTENDED_RELEASE_TABLET | Freq: Two times a day (BID) | ORAL | Status: DC

## 2015-12-10 MED ORDER — FUROSEMIDE 40 MG PO TABS: 40.0000 mg | ORAL_TABLET | Freq: Two times a day (BID) | ORAL | Status: DC

## 2015-12-10 NOTE — ED Provider Notes (Signed)
CSN: [id number]     Arrival date & time 12/10/15  0945 History  By signing my name below, I, [name], attest that this documentation has been prepared under the direction and in the presence of Benjiman Core, MD. Electronically Signed: Phillis [name], ED Scribe. 12/10/2015. 10:14 AM.   Chief Complaint  Patient presents with  . Leg Swelling   The history is provided by the patient. No language interpreter was used.  HPI Comments: [name] is a 75 y.o. Male with a hx of COPD, arthritis, gout, HTN, CAD, GERD, and Type II DM who presents to the Emergency Department complaining of intermittent left leg swelling onset 3 weeks ago, worsening today. He reports the worst pain the lateral portion of the leg. He states that he twisted his ankle 3 months ago, but has not injured it since then. He has used ice on the area to mild relief. Pt also complains of "fullness" to the abdomen and is unable to have a BM despite having the urge to do so. He states that when he attempts to use the bathroom, he will just pass gas. Last normal BM was yesterday. He denies fever, chills, chest pain, SOB, nausea or vomiting. Pt is on blood thinners.   Past Medical History  Diagnosis Date  . HTN (hypertension)   . Asthma   . Gout   . Arthritis     ? type ? RA  . Elevated cholesterol   . Acid reflux   . COPD (chronic obstructive pulmonary disease) (HCC)   . CAD (coronary artery disease)   . GERD (gastroesophageal reflux disease)   . Atrial fibrillation (HCC)   . OSA (obstructive sleep apnea)     AHI 130 5/10. CPAP 19  . DM2 (diabetes mellitus, type 2) (HCC)    Past Surgical History  Procedure Laterality Date  . Appendectomy    . Right and left carpel tunnel release    . Fluid of abdomen- removal of cyst 2010    . Abdominal surgery    . Colon resection     Family History  Problem Relation Age of Onset  . Heart attack Brother   . Emphysema      uncle   Social History  Substance Use Topics  .  Smoking status: Former Smoker -- 1.50 packs/day for 21 years    Types: Cigarettes    Quit date: 06/10/1978  . Smokeless tobacco: Never Used  . Alcohol Use: No    Review of Systems  Constitutional: Negative for fever and chills.  Respiratory: Negative for shortness of breath.   Cardiovascular: Positive for leg swelling. Negative for chest pain.  Gastrointestinal: Positive for abdominal distention. Negative for nausea and vomiting.  All other systems reviewed and are negative.  Allergies  Lipitor  Home Medications   Prior to Admission medications   Medication Sig Start Date End Date Taking? Authorizing Provider  albuterol (PROAIR HFA) 108 (90 BASE) MCG/ACT inhaler Inhale 1-2 puffs into the lungs 2 (two) times daily. Patient taking differently: Inhale 1-2 puffs into the lungs every 4 (four) hours as needed for wheezing.  07/17/11  Yes Storm Frisk, MD  albuterol (PROVENTIL) (2.5 MG/3ML) 0.083% nebulizer solution Take 3 mLs (2.5 mg total) by nebulization 3 (three) times daily. 07/17/11  Yes Storm Frisk, MD  allopurinol (ZYLOPRIM) 300 MG tablet Take 300 mg by mouth daily.   Yes Historical Provider, MD  amLODipine (NORVASC) 10 MG tablet Take 5-10 mg by mouth See admin  instructions. Take one-half tablet daily.  If blood pressure is over 140/90, then take 1 whole tablet.   Yes Historical Provider, MD  apixaban (ELIQUIS) 5 MG TABS tablet Take 5 mg by mouth 2 (two) times daily.   Yes Historical Provider, MD  atorvastatin (LIPITOR) 80 MG tablet Take 40 mg by mouth at bedtime.   Yes Historical Provider, MD  carvedilol (COREG) 12.5 MG tablet Take 12.5 mg by mouth 2 (two) times daily with a meal.   Yes Historical Provider, MD  lisinopril (PRINIVIL,ZESTRIL) 40 MG tablet Take 40 mg by mouth daily.   Yes Historical Provider, MD  Melatonin 3 MG TABS Take 1 tablet by mouth at bedtime.   Yes Historical Provider, MD  Mometasone Furo-Formoterol Fum (DULERA) 200-5 MCG/ACT AERO Inhale 2 puffs into the  lungs 2 (two) times daily. 02/26/12  Yes Storm FriskPatrick E Wright, MD  pantoprazole (PROTONIX) 40 MG tablet Take 40 mg by mouth daily.    Yes Historical Provider, MD  propranolol ER (INDERAL LA) 80 MG 24 hr capsule Take 80 mg by mouth daily.   Yes Historical Provider, MD  tamsulosin (FLOMAX) 0.4 MG CAPS capsule Take 0.4 mg by mouth daily after supper.   Yes Historical Provider, MD  tiotropium (SPIRIVA) 18 MCG inhalation capsule Place 18 mcg into inhaler and inhale daily.   Yes Historical Provider, MD  traZODone (DESYREL) 100 MG tablet Take 2,000 mg by mouth at bedtime as needed for sleep.   Yes Historical Provider, MD  furosemide (LASIX) 40 MG tablet Take 1 tablet (40 mg total) by mouth 2 (two) times daily. 12/10/15   Benjiman CoreNathan Terryl Niziolek, MD  potassium chloride SA (KLOR-CON M20) 20 MEQ tablet Take 1 tablet (20 mEq total) by mouth 2 (two) times daily. 12/10/15   Benjiman CoreNathan Haeleigh Streiff, MD   BP 120/67 mmHg  Pulse 64  Temp(Src) 97.8 F (36.6 C) (Oral)  Resp 16  Ht 5\' 7"  (1.702 m)  Wt 268 lb (121.564 kg)  BMI 41.96 kg/m2  SpO2 95% Physical Exam  Constitutional: He is oriented to person, place, and time. He appears well-developed and well-nourished.  HENT:  Head: Normocephalic and atraumatic.  Eyes: EOM are normal. Pupils are equal, round, and reactive to light.  Neck: Normal range of motion. Neck supple.  Cardiovascular: Normal rate, regular rhythm and normal heart sounds.  Exam reveals no gallop and no friction rub.   No murmur heard. Pulmonary/Chest: Effort normal and breath sounds normal. He has no wheezes.  Abdominal: Soft. He exhibits distension. There is no tenderness.  Reducible umbilical hernia; mild abdominal distension  Musculoskeletal: Normal range of motion. He exhibits edema and tenderness.  Pitting edema bilaterally, left worse than right Swelling over mid left foot with tenderness DP pulses intact bilaterally  Neurological: He is alert and oriented to person, place, and time.  Skin: Skin is  warm and dry.  Psychiatric: He has a normal mood and affect. His behavior is normal.  Nursing note and vitals reviewed.   ED Course  Procedures (including critical care time) DIAGNOSTIC STUDIES:     COORDINATION OF CARE: 10:11 AM-Discussed treatment plan which includes labs and x-rays with pt at bedside and pt agreed to plan.    Labs Review Labs Reviewed  COMPREHENSIVE METABOLIC PANEL - Abnormal; Notable for the following:    Glucose, Bld 226 (*)    Creatinine, Ser 1.60 (*)    Calcium 8.4 (*)    Albumin 3.4 (*)    ALT 11 (*)    GFR  calc non Af Amer 41 (*)    GFR calc Af Amer 47 (*)    All other components within normal limits  CBC WITH DIFFERENTIAL/PLATELET - Abnormal; Notable for the following:    WBC 10.9 (*)    Hemoglobin 12.7 (*)    RDW 16.2 (*)    Neutro Abs 8.3 (*)    All other components within normal limits    Imaging Review US Venous Img Lower Unilateral Left  12/10/2015  CLINICAL DATA:  Lower extremity swelling for 3 weeks with pain. EXAM: Left LOWER EXTREMITY VENOUS DOPPLER ULTRASOUND TECHNIQUE: Gray-scale sonography with graded compression, as well as color Doppler and duplex ultrasound were performed to evaluate the lower extremity deep venous systems from the level of the common femoral vein and including the common femoral, femoral, profunda femoral, popliteal and calf veins including the posterior tibial, peroneal and gastrocnemius veins when visible. The superficial great saphenous vein was also interrogated. Spectral Doppler was utilized to evaluate flow at rest and with distal augmentation maneuvers in the common femoral, femoral and popliteal veins. COMPARISON:  None. FINDINGS: Contralateral Common Femoral Vein: Respiratory phasicity is normal and symmetric with the symptomatic side. No evidence of thrombus. Normal compressibility. Common Femoral Vein: No evidence of thrombus. Normal compressibility, respiratory phasicity and response to augmentation.  Saphenofemoral Junction: No evidence of thrombus. Normal compressibility and flow on color Doppler imaging. Profunda Femoral Vein: No evidence of thrombus. Normal compressibility and flow on color Doppler imaging. Femoral Vein: No evidence of thrombus. Normal compressibility, respiratory phasicity and response to augmentation. Popliteal Vein: No evidence of thrombus. Normal compressibility, respiratory phasicity and response to augmentation. Calf Veins: No evidence of thrombus. Normal compressibility and flow on color Doppler imaging. Superficial Great Saphenous Vein: No evidence of thrombus. Normal compressibility and flow on color Doppler imaging. IMPRESSION: No evidence of deep venous thrombosis. Electronically Signed   By: Kennith Center M.D.   On: 12/10/2015 11:42   Dg Abd Acute W/chest  12/10/2015  CLINICAL DATA:  Abdominal pain with nausea EXAM: DG ABDOMEN ACUTE W/ 1V CHEST COMPARISON:  Chest 02/20/2009 FINDINGS: Increased lung markings in the bases which may be infiltrate or atelectasis. No heart failure or effusion Normal bowel gas pattern.  No obstruction or ileus.  No free air. No renal calculi.  No acute skeletal abnormality. IMPRESSION: Mild bibasilar atelectasis/ infiltrate Normal bowel gas pattern Electronically Signed   By: Marlan Palau M.D.   On: 12/10/2015 10:41   Dg Foot Complete Left  12/10/2015  CLINICAL DATA:  Left foot pain 3 weeks.  No injury EXAM: LEFT FOOT - COMPLETE 3+ VIEW COMPARISON:  04/25/2009 FINDINGS: Negative for fracture. No acute bony abnormality. Mild degenerative change in the midfoot. Calcaneal spurring at the Achilles tendon and plantar fascia insertion. No erosion. IMPRESSION: Mild degenerative changes.  No acute abnormality. Electronically Signed   By: Marlan Palau M.D.   On: 12/10/2015 10:43   I have personally reviewed and evaluated these images and lab results as part of my medical decision-making.   EKG Interpretation None      MDM   Final diagnoses:   Peripheral edema  Umbilical hernia without obstruction and without gangrene    Patient with swelling in his leg. Negative Doppler. BNP mildly elevated. Creatinine is also mildly elevated but will gently increase Lasix for a few days. Will follow-up with his primary care doctor.  I personally performed the services described in this documentation, which was scribed in my presence. The recorded information has  been reviewed and is accurate.       Benjiman CoreNathan Fernanda Twaddell, MD 12/10/15 1520

## 2015-12-10 NOTE — ED Notes (Signed)
Pt c/o intermittent swelling to right leg x 1 week, worse today. Pt also c/o fullness in abdomen with urge to have bowel movement but unable to. Denies n/v. lnbm yesterday.

## 2015-12-10 NOTE — Discharge Instructions (Signed)
Take the increased dose of Lasix and potassium for 4 days. Follow-up through primary care doctor and have your kidney function rechecked.  Peripheral Edema You have swelling in your legs (peripheral edema). This swelling is due to excess accumulation of salt and water in your body. Edema may be a sign of heart, kidney or liver disease, or a side effect of a medication. It may also be due to problems in the leg veins. Elevating your legs and using special support stockings may be very helpful, if the cause of the swelling is due to poor venous circulation. Avoid long periods of standing, whatever the cause. Treatment of edema depends on identifying the cause. Chips, pretzels, pickles and other salty foods should be avoided. Restricting salt in your diet is almost always needed. Water pills (diuretics) are often used to remove the excess salt and water from your body via urine. These medicines prevent the kidney from reabsorbing sodium. This increases urine flow. Diuretic treatment may also result in lowering of potassium levels in your body. Potassium supplements may be needed if you have to use diuretics daily. Daily weights can help you keep track of your progress in clearing your edema. You should call your caregiver for follow up care as recommended. SEEK IMMEDIATE MEDICAL CARE IF:   You have increased swelling, pain, redness, or heat in your legs.  You develop shortness of breath, especially when lying down.  You develop chest or abdominal pain, weakness, or fainting.  You have a fever.   This information is not intended to replace advice given to you by your health care provider. Make sure you discuss any questions you have with your health care provider.   Document Released: 07/04/2004 Document Revised: 08/19/2011 Document Reviewed: 12/07/2014 Elsevier Interactive Patient Education 2016 [employer]  Hernia, Adult A hernia is the bulging of an organ or tissue through a weak spot in the  muscles of the abdomen (abdominal wall). Hernias develop most often near the navel or groin. There are many kinds of hernias. Common kinds include:  Femoral hernia. This kind of hernia develops under the groin in the upper thigh area.  Inguinal hernia. This kind of hernia develops in the groin or scrotum.  Umbilical hernia. This kind of hernia develops near the navel.  Hiatal hernia. This kind of hernia causes part of the stomach to be pushed up into the chest.  Incisional hernia. This kind of hernia bulges through a scar from an abdominal surgery. CAUSES This condition may be caused by:  Heavy lifting.  Coughing over a long period of time.  Straining to have a bowel movement.  An incision made during an abdominal surgery.  A birth defect (congenital defect).  Excess weight or obesity.  Smoking.  Poor nutrition.  Cystic fibrosis.  Excess fluid in the abdomen.  Undescended testicles. SYMPTOMS Symptoms of a hernia include:  A lump on the abdomen. This is the first sign of a hernia. The lump may become more obvious with standing, straining, or coughing. It may get bigger over time if it is not treated or if the condition causing it is not treated.  Pain. A hernia is usually painless, but it may become painful over time if treatment is delayed. The pain is usually dull and may get worse with standing or lifting heavy objects. Sometimes a hernia gets tightly squeezed in the weak spot (strangulated) or stuck there (incarcerated) and causes additional symptoms. These symptoms may include:  Vomiting.  Nausea.  Constipation.  Irritability. DIAGNOSIS A hernia may be diagnosed with:  A physical exam. During the exam your health care provider may ask you to cough or to make a specific movement, because a hernia is usually more visible when you move.  Imaging tests. These can include:  X-rays.  Ultrasound.  CT scan. TREATMENT A hernia that is small and painless may  not need to be treated. A hernia that is large or painful may be treated with surgery. Inguinal hernias may be treated with surgery to prevent incarceration or strangulation. Strangulated hernias are always treated with surgery, because lack of blood to the trapped organ or tissue can cause it to die. Surgery to treat a hernia involves pushing the bulge back into place and repairing the weak part of the abdomen. HOME CARE INSTRUCTIONS  Avoid straining.  Do not lift anything heavier than 10 lb (4.5 kg).  Lift with your leg muscles, not your back muscles. This helps avoid strain.  When coughing, try to cough gently.  Prevent constipation. Constipation leads to straining with bowel movements, which can make a hernia worse or cause a hernia repair to break down. You can prevent constipation by:  Eating a high-fiber diet that includes plenty of fruits and vegetables.  Drinking enough fluids to keep your urine clear or pale yellow. Aim to drink 6-8 glasses of water per day.  Using a stool softener as directed by your health care provider.  Lose weight, if you are overweight.  Do not use any tobacco products, including cigarettes, chewing tobacco, or electronic cigarettes. If you need help quitting, ask your health care provider.  Keep all follow-up visits as directed by your health care provider. This is important. Your health care provider may need to monitor your condition. SEEK MEDICAL CARE IF:  You have swelling, redness, and pain in the affected area.  Your bowel habits change. SEEK IMMEDIATE MEDICAL CARE IF:  You have a fever.  You have abdominal pain that is getting worse.  You feel nauseous or you vomit.  You cannot push the hernia back in place by gently pressing on it while you are lying down.  The hernia:  Changes in shape or size.  Is stuck outside the abdomen.  Becomes discolored.  Feels hard or tender.   This information is not intended to replace advice  given to you by your health care provider. Make sure you discuss any questions you have with your health care provider.   Document Released: 05/27/2005 Document Revised: 06/17/2014 Document Reviewed: 04/06/2014 Elsevier Interactive Patient Education [employer] Inc[employer]

## 2022-12-23 ENCOUNTER — Other Ambulatory Visit: Payer: Self-pay

## 2022-12-23 ENCOUNTER — Emergency Department (HOSPITAL_COMMUNITY): Payer: No Typology Code available for payment source

## 2022-12-23 ENCOUNTER — Encounter (HOSPITAL_COMMUNITY): Payer: Self-pay | Admitting: Emergency Medicine

## 2022-12-23 ENCOUNTER — Inpatient Hospital Stay (HOSPITAL_COMMUNITY)
Admission: EM | Admit: 2022-12-23 | Discharge: 2022-12-27 | DRG: 291 | Disposition: A | Discharge status: Home / Self Care | Payer: No Typology Code available for payment source | Attending: Family Medicine | Admitting: Family Medicine

## 2022-12-23 DIAGNOSIS — Z8249 Family history of ischemic heart disease and other diseases of the circulatory system: Secondary | ICD-10-CM

## 2022-12-23 DIAGNOSIS — J9621 Acute and chronic respiratory failure with hypoxia: Secondary | ICD-10-CM | POA: Diagnosis present

## 2022-12-23 DIAGNOSIS — Z6835 Body mass index (BMI) 35.0-35.9, adult: Secondary | ICD-10-CM

## 2022-12-23 DIAGNOSIS — I440 Atrioventricular block, first degree: Secondary | ICD-10-CM | POA: Diagnosis present

## 2022-12-23 DIAGNOSIS — R0989 Other specified symptoms and signs involving the circulatory and respiratory systems: Secondary | ICD-10-CM | POA: Diagnosis not present

## 2022-12-23 DIAGNOSIS — I2510 Atherosclerotic heart disease of native coronary artery without angina pectoris: Secondary | ICD-10-CM | POA: Diagnosis present

## 2022-12-23 DIAGNOSIS — I5082 Biventricular heart failure: Secondary | ICD-10-CM | POA: Diagnosis present

## 2022-12-23 DIAGNOSIS — I4819 Other persistent atrial fibrillation: Secondary | ICD-10-CM | POA: Diagnosis present

## 2022-12-23 DIAGNOSIS — Z888 Allergy status to other drugs, medicaments and biological substances status: Secondary | ICD-10-CM | POA: Diagnosis not present

## 2022-12-23 DIAGNOSIS — R059 Cough, unspecified: Secondary | ICD-10-CM | POA: Diagnosis not present

## 2022-12-23 DIAGNOSIS — I509 Heart failure, unspecified: Secondary | ICD-10-CM

## 2022-12-23 DIAGNOSIS — Z87891 Personal history of nicotine dependence: Secondary | ICD-10-CM | POA: Diagnosis not present

## 2022-12-23 DIAGNOSIS — Z7951 Long term (current) use of inhaled steroids: Secondary | ICD-10-CM

## 2022-12-23 DIAGNOSIS — E669 Obesity, unspecified: Secondary | ICD-10-CM | POA: Diagnosis present

## 2022-12-23 DIAGNOSIS — J439 Emphysema, unspecified: Secondary | ICD-10-CM | POA: Diagnosis present

## 2022-12-23 DIAGNOSIS — M109 Gout, unspecified: Secondary | ICD-10-CM | POA: Diagnosis present

## 2022-12-23 DIAGNOSIS — Z9981 Dependence on supplemental oxygen: Secondary | ICD-10-CM

## 2022-12-23 DIAGNOSIS — K219 Gastro-esophageal reflux disease without esophagitis: Secondary | ICD-10-CM | POA: Diagnosis not present

## 2022-12-23 DIAGNOSIS — Z79899 Other long term (current) drug therapy: Secondary | ICD-10-CM | POA: Diagnosis not present

## 2022-12-23 DIAGNOSIS — I5041 Acute combined systolic (congestive) and diastolic (congestive) heart failure: Secondary | ICD-10-CM | POA: Diagnosis not present

## 2022-12-23 DIAGNOSIS — Z885 Allergy status to narcotic agent status: Secondary | ICD-10-CM

## 2022-12-23 DIAGNOSIS — G4733 Obstructive sleep apnea (adult) (pediatric): Secondary | ICD-10-CM | POA: Diagnosis present

## 2022-12-23 DIAGNOSIS — I5043 Acute on chronic combined systolic (congestive) and diastolic (congestive) heart failure: Secondary | ICD-10-CM | POA: Diagnosis present

## 2022-12-23 DIAGNOSIS — I110 Hypertensive heart disease with heart failure: Secondary | ICD-10-CM | POA: Diagnosis not present

## 2022-12-23 DIAGNOSIS — J4489 Other specified chronic obstructive pulmonary disease: Secondary | ICD-10-CM | POA: Diagnosis not present

## 2022-12-23 DIAGNOSIS — Z7901 Long term (current) use of anticoagulants: Secondary | ICD-10-CM

## 2022-12-23 DIAGNOSIS — R918 Other nonspecific abnormal finding of lung field: Secondary | ICD-10-CM | POA: Diagnosis not present

## 2022-12-23 DIAGNOSIS — J8410 Pulmonary fibrosis, unspecified: Secondary | ICD-10-CM | POA: Diagnosis not present

## 2022-12-23 DIAGNOSIS — I130 Hypertensive heart and chronic kidney disease with heart failure and stage 1 through stage 4 chronic kidney disease, or unspecified chronic kidney disease: Secondary | ICD-10-CM | POA: Diagnosis not present

## 2022-12-23 DIAGNOSIS — E782 Mixed hyperlipidemia: Secondary | ICD-10-CM | POA: Diagnosis present

## 2022-12-23 DIAGNOSIS — Z825 Family history of asthma and other chronic lower respiratory diseases: Secondary | ICD-10-CM

## 2022-12-23 DIAGNOSIS — R7989 Other specified abnormal findings of blood chemistry: Secondary | ICD-10-CM | POA: Insufficient documentation

## 2022-12-23 DIAGNOSIS — N184 Chronic kidney disease, stage 4 (severe): Secondary | ICD-10-CM | POA: Diagnosis present

## 2022-12-23 DIAGNOSIS — N400 Enlarged prostate without lower urinary tract symptoms: Secondary | ICD-10-CM | POA: Diagnosis not present

## 2022-12-23 DIAGNOSIS — I5031 Acute diastolic (congestive) heart failure: Secondary | ICD-10-CM | POA: Diagnosis not present

## 2022-12-23 DIAGNOSIS — I10 Essential (primary) hypertension: Secondary | ICD-10-CM | POA: Diagnosis not present

## 2022-12-23 DIAGNOSIS — I50810 Right heart failure, unspecified: Secondary | ICD-10-CM | POA: Diagnosis not present

## 2022-12-23 DIAGNOSIS — N179 Acute kidney failure, unspecified: Secondary | ICD-10-CM

## 2022-12-23 DIAGNOSIS — J449 Chronic obstructive pulmonary disease, unspecified: Secondary | ICD-10-CM | POA: Diagnosis not present

## 2022-12-23 DIAGNOSIS — E1122 Type 2 diabetes mellitus with diabetic chronic kidney disease: Secondary | ICD-10-CM | POA: Diagnosis present

## 2022-12-23 DIAGNOSIS — R590 Localized enlarged lymph nodes: Secondary | ICD-10-CM | POA: Diagnosis not present

## 2022-12-23 DIAGNOSIS — I480 Paroxysmal atrial fibrillation: Secondary | ICD-10-CM | POA: Diagnosis not present

## 2022-12-23 DIAGNOSIS — I517 Cardiomegaly: Secondary | ICD-10-CM | POA: Diagnosis not present

## 2022-12-23 DIAGNOSIS — I5021 Acute systolic (congestive) heart failure: Secondary | ICD-10-CM | POA: Diagnosis not present

## 2022-12-23 LAB — BASIC METABOLIC PANEL
Anion gap: 9 (ref 5–15)
BUN: 40 mg/dL — ABNORMAL HIGH (ref 8–23)
CO2: 23 mmol/L (ref 22–32)
Calcium: 8.6 mg/dL — ABNORMAL LOW (ref 8.9–10.3)
Chloride: 102 mmol/L (ref 98–111)
Creatinine, Ser: 3.21 mg/dL — ABNORMAL HIGH (ref 0.61–1.24)
GFR, Estimated: 19 mL/min — ABNORMAL LOW (ref >60)
Glucose, Bld: 89 mg/dL (ref 70–99)
Potassium: 4.4 mmol/L (ref 3.5–5.1)
Sodium: 134 mmol/L — ABNORMAL LOW (ref 135–145)

## 2022-12-23 LAB — CBC
HCT: 39.3 % (ref 39.0–52.0)
Hemoglobin: 12 g/dL — ABNORMAL LOW (ref 13.0–17.0)
MCH: 29.1 pg (ref 26.0–34.0)
MCHC: 30.5 g/dL (ref 30.0–36.0)
MCV: 95.2 fL (ref 80.0–100.0)
Platelets: 243 10*3/uL (ref 150–400)
RBC: 4.13 MIL/uL — ABNORMAL LOW (ref 4.22–5.81)
RDW: 16.4 % — ABNORMAL HIGH (ref 11.5–15.5)
WBC: 10.9 10*3/uL — ABNORMAL HIGH (ref 4.0–10.5)
nRBC: 0 % (ref 0.0–0.2)

## 2022-12-23 LAB — PROTIME-INR
INR: 1.3 — ABNORMAL HIGH (ref 0.8–1.2)
Prothrombin Time: 15.9 seconds — ABNORMAL HIGH (ref 11.4–15.2)

## 2022-12-23 LAB — BRAIN NATRIURETIC PEPTIDE: B Natriuretic Peptide: 293 pg/mL — ABNORMAL HIGH (ref 0.0–100.0)

## 2022-12-23 LAB — TROPONIN I (HIGH SENSITIVITY)
Troponin I (High Sensitivity): 17 ng/L (ref <18)
Troponin I (High Sensitivity): 19 ng/L — ABNORMAL HIGH (ref <18)
Troponin I (High Sensitivity): 19 ng/L — ABNORMAL HIGH (ref <18)

## 2022-12-23 MED ORDER — LISINOPRIL 10 MG PO TABS
40.0000 mg | ORAL_TABLET | Freq: Every day | ORAL | Status: DC
  Administered 2022-12-24 – 2022-12-25 (×2): 40 mg via ORAL
  Filled 2022-12-23 (×3): qty 4

## 2022-12-23 MED ORDER — CARVEDILOL 12.5 MG PO TABS
12.5000 mg | ORAL_TABLET | Freq: Two times a day (BID) | ORAL | Status: DC
  Administered 2022-12-24 – 2022-12-25 (×4): 12.5 mg via ORAL
  Filled 2022-12-23 (×5): qty 1

## 2022-12-23 MED ORDER — TIOTROPIUM BROMIDE MONOHYDRATE 18 MCG IN CAPS: 18.0000 ug | ORAL_CAPSULE | Freq: Every day | RESPIRATORY_TRACT | Status: DC

## 2022-12-23 MED ORDER — MELATONIN 3 MG PO TABS
3.0000 mg | ORAL_TABLET | Freq: Every day | ORAL | Status: DC
  Administered 2022-12-23 – 2022-12-26 (×4): 3 mg via ORAL
  Filled 2022-12-23 (×4): qty 1

## 2022-12-23 MED ORDER — MOMETASONE FURO-FORMOTEROL FUM 200-5 MCG/ACT IN AERO
2.0000 | INHALATION_SPRAY | Freq: Two times a day (BID) | RESPIRATORY_TRACT | Status: DC
  Administered 2022-12-24 – 2022-12-27 (×7): 2 via RESPIRATORY_TRACT
  Filled 2022-12-23: qty 8.8

## 2022-12-23 MED ORDER — ALBUTEROL SULFATE (2.5 MG/3ML) 0.083% IN NEBU
2.5000 mg | INHALATION_SOLUTION | Freq: Three times a day (TID) | RESPIRATORY_TRACT | Status: DC
  Administered 2022-12-24 – 2022-12-27 (×10): 2.5 mg via RESPIRATORY_TRACT
  Filled 2022-12-23 (×10): qty 3

## 2022-12-23 MED ORDER — ALBUTEROL SULFATE (2.5 MG/3ML) 0.083% IN NEBU: 3.0000 mL | INHALATION_SOLUTION | RESPIRATORY_TRACT | Status: DC | PRN

## 2022-12-23 MED ORDER — PANTOPRAZOLE SODIUM 40 MG PO TBEC
40.0000 mg | DELAYED_RELEASE_TABLET | Freq: Every day | ORAL | Status: DC
  Administered 2022-12-24 – 2022-12-27 (×4): 40 mg via ORAL
  Filled 2022-12-23 (×4): qty 1

## 2022-12-23 MED ORDER — FUROSEMIDE 10 MG/ML IJ SOLN
40.0000 mg | Freq: Once | INTRAMUSCULAR | Status: AC
  Administered 2022-12-23: 40 mg via INTRAVENOUS
  Filled 2022-12-23: qty 4

## 2022-12-23 MED ORDER — UMECLIDINIUM BROMIDE 62.5 MCG/ACT IN AEPB
1.0000 | INHALATION_SPRAY | Freq: Every day | RESPIRATORY_TRACT | Status: DC
  Administered 2022-12-24 – 2022-12-27 (×4): 1 via RESPIRATORY_TRACT
  Filled 2022-12-23: qty 7

## 2022-12-23 MED ORDER — FUROSEMIDE 10 MG/ML IJ SOLN
40.0000 mg | Freq: Two times a day (BID) | INTRAMUSCULAR | Status: DC
  Administered 2022-12-24 – 2022-12-25 (×3): 40 mg via INTRAVENOUS
  Filled 2022-12-23 (×3): qty 4

## 2022-12-23 MED ORDER — ATORVASTATIN CALCIUM 40 MG PO TABS: 40.0000 mg | ORAL_TABLET | Freq: Every day | ORAL | Status: DC

## 2022-12-23 MED ORDER — APIXABAN 2.5 MG PO TABS
2.5000 mg | ORAL_TABLET | Freq: Two times a day (BID) | ORAL | Status: DC
  Administered 2022-12-23 – 2022-12-27 (×8): 2.5 mg via ORAL
  Filled 2022-12-23 (×8): qty 1

## 2022-12-23 MED ORDER — TAMSULOSIN HCL 0.4 MG PO CAPS
0.4000 mg | ORAL_CAPSULE | Freq: Every day | ORAL | Status: DC
  Administered 2022-12-24 – 2022-12-26 (×3): 0.4 mg via ORAL
  Filled 2022-12-23 (×3): qty 1

## 2022-12-23 NOTE — ED Provider Notes (Signed)
[place] EMERGENCY DEPARTMENT AT [facility] Provider Note   CSN: [id number] Arrival date & time: 12/23/22  1437     History {Add pertinent medical, surgical, social history, OB history to HPI:1} Chief Complaint  Patient presents with   Shortness of Breath    [name] is a 82 y.o. male.  He has a history of A-fib on Eliquis, COPD, diabetes, coronary disease.  He is using 2 L nasal cannula as needed.  For the last 10 days he has been more short of breath, dyspnea on exertion and requiring oxygen at all times.  No real cough but he says he brings up some white sputum with some streaks of blood.  No frank hemoptysis.  No fevers or chills no chest pain.  No significant leg edema.  He called the [place] today who recommended he come here for further evaluation.  He has been compliant with his blood thinners and other medications.  The history is provided by the patient and the spouse.  Shortness of Breath Severity:  Moderate Onset quality:  Gradual Duration:  10 days Timing:  Constant Progression:  Unchanged Chronicity:  New Context: activity   Relieved by:  Oxygen and rest Worsened by:  Activity Ineffective treatments:  Oxygen Associated symptoms: hemoptysis and sputum production   Associated symptoms: no abdominal pain, no chest pain, no fever and no wheezing   Risk factors: no tobacco use        Home Medications Prior to Admission medications   Medication Sig Start Date End Date Taking? Authorizing Provider  albuterol (PROAIR HFA) 108 (90 BASE) MCG/ACT inhaler Inhale 1-2 puffs into the lungs 2 (two) times daily. Patient taking differently: Inhale 1-2 puffs into the lungs every 4 (four) hours as needed for wheezing.  07/17/11   Storm Frisk, MD  albuterol (PROVENTIL) (2.5 MG/3ML) 0.083% nebulizer solution Take 3 mLs (2.5 mg total) by nebulization 3 (three) times daily. 07/17/11   Storm Frisk, MD  allopurinol (ZYLOPRIM) 300 MG tablet Take 300 mg by mouth daily.     [provider]  amLODipine (NORVASC) 10 MG tablet Take 5-10 mg by mouth See admin instructions. Take one-half tablet daily.  If blood pressure is over 140/90, then take 1 whole tablet.    [provider]  apixaban (ELIQUIS) 5 MG TABS tablet Take 5 mg by mouth 2 (two) times daily.    [provider]  atorvastatin (LIPITOR) 80 MG tablet Take 40 mg by mouth at bedtime.    [provider]  carvedilol (COREG) 12.5 MG tablet Take 12.5 mg by mouth 2 (two) times daily with a meal.    [provider]  furosemide (LASIX) 40 MG tablet Take 1 tablet (40 mg total) by mouth 2 (two) times daily. 12/10/15   Benjiman Core, MD  lisinopril (PRINIVIL,ZESTRIL) 40 MG tablet Take 40 mg by mouth daily.    [provider]  Melatonin 3 MG TABS Take 1 tablet by mouth at bedtime.    [provider]  Mometasone Furo-Formoterol Fum (DULERA) 200-5 MCG/ACT AERO Inhale 2 puffs into the lungs 2 (two) times daily. 02/26/12   Storm Frisk, MD  pantoprazole (PROTONIX) 40 MG tablet Take 40 mg by mouth daily.     [provider]  potassium chloride SA (KLOR-CON M20) 20 MEQ tablet Take 1 tablet (20 mEq total) by mouth 2 (two) times daily. 12/10/15   Benjiman Core, MD  propranolol ER (INDERAL LA) 80 MG 24 hr  capsule Take 80 mg by mouth daily.    [provider]  tamsulosin (FLOMAX) 0.4 MG CAPS capsule Take 0.4 mg by mouth daily after supper.    [provider]  tiotropium (SPIRIVA) 18 MCG inhalation capsule Place 18 mcg into inhaler and inhale daily.    [provider]  traZODone (DESYREL) 100 MG tablet Take 2,000 mg by mouth at bedtime as needed for sleep.    [provider]      Allergies    Codeine and Lipitor [atorvastatin]    Review of Systems   Review of Systems  Constitutional:  Negative for fever.  Respiratory:  Positive for hemoptysis, sputum production and shortness of breath. Negative for wheezing.    Cardiovascular:  Negative for chest pain.  Gastrointestinal:  Negative for abdominal pain.    Physical Exam Updated Vital Signs BP 118/66 (BP Location: Right Arm)   Pulse 76   Temp 98.1 F (36.7 C) (Oral)   Resp 12   Ht 5\' 7"  (1.702 m)   Wt 98.4 kg   SpO2 94%   BMI 33.99 kg/m  Physical Exam Vitals and nursing note reviewed.  Constitutional:      General: He is not in acute distress.    Appearance: He is well-developed.  HENT:     Head: Normocephalic and atraumatic.  Eyes:     Conjunctiva/sclera: Conjunctivae normal.  Cardiovascular:     Rate and Rhythm: Normal rate and regular rhythm.     Heart sounds: No murmur heard. Pulmonary:     Effort: Tachypnea and accessory muscle usage present. No respiratory distress.     Breath sounds: Decreased breath sounds present.  Abdominal:     Palpations: Abdomen is soft.     Tenderness: There is no abdominal tenderness.  Musculoskeletal:        General: No swelling.     Cervical back: Neck supple.     Right lower leg: No tenderness. No edema.     Left lower leg: No edema.  Skin:    General: Skin is warm and dry.     Capillary Refill: Capillary refill takes less than 2 seconds.  Neurological:     General: No focal deficit present.     Mental Status: He is alert.     ED Results / Procedures / Treatments   Labs (all labs ordered are listed, but only abnormal results are displayed) Labs Reviewed  BASIC METABOLIC PANEL - Abnormal; Notable for the following components:      Result Value   Sodium 134 (*)    BUN 40 (*)    Creatinine, Ser 3.21 (*)    Calcium 8.6 (*)    GFR, Estimated 19 (*)    All other components within normal limits  CBC - Abnormal; Notable for the following components:   WBC 10.9 (*)    RBC 4.13 (*)    Hemoglobin 12.0 (*)    RDW 16.4 (*)    All other components within normal limits  PROTIME-INR - Abnormal; Notable for the following components:   Prothrombin Time 15.9 (*)    INR 1.3 (*)    All other  components within normal limits  BRAIN NATRIURETIC PEPTIDE  TROPONIN I (HIGH SENSITIVITY)    EKG EKG Interpretation Date/Time:  Monday December 23 2022 14:48:17 EDT Ventricular Rate:  70 PR Interval:  234 QRS Duration:  116 QT Interval:  424 QTC Calculation: 457 R Axis:   63  Text Interpretation: Sinus rhythm with 1st  degree A-V block with Premature supraventricular complexes Otherwise normal ECG When compared with ECG of 18-Dec-2007 06:35, Premature supraventricular complexes are now Present PR interval has increased Questionable change in QRS duration Confirmed by Meridee Score [phone]) on 12/23/2022 3:04:16 PM  Radiology DG Chest 2 View  Result Date: 12/23/2022 CLINICAL DATA:  COPD EXAM: CHEST - 2 VIEW COMPARISON:  12/10/2015. FINDINGS: There are nodular opacities at bilateral lung bases, left greater than right, which are indeterminate and may represent pneumonia versus artifacts. Correlate clinically to determine the need for further imaging with chest CT scan. Bilateral costophrenic angles are clear. Stable cardio-mediastinal silhouette. No acute osseous abnormalities. The soft tissues are within normal limits. IMPRESSION: *Nodular opacities at bilateral lung bases, left greater than right, which are indeterminate and may represent pneumonia versus artifacts. Electronically Signed   By: Jules Schick M.D.   On: 12/23/2022 15:34    Procedures Procedures  {Document cardiac monitor, telemetry assessment procedure when appropriate:1}  Medications Ordered in ED Medications  albuterol (PROVENTIL) (2.5 MG/3ML) 0.083% nebulizer solution 3 mL (has no administration in time range)    ED Course/ Medical Decision Making/ A&P   {   Click here for ABCD2, HEART and other calculatorsREFRESH Note before signing :1}                          Medical Decision Making Amount and/or Complexity of Data Reviewed Labs: ordered. Radiology: ordered.  Risk Prescription drug management.   This  patient complains of ***; this involves an extensive number of treatment Options and is a complaint that carries with it a high risk of complications and morbidity. The differential includes ***  I ordered, reviewed and interpreted labs, which included *** I ordered medication *** and reviewed PMP when indicated. I ordered imaging studies which included *** and I independently    visualized and interpreted imaging which showed *** Additional history obtained from *** Previous records obtained and reviewed *** I consulted *** and discussed lab and imaging findings and discussed disposition.  Cardiac monitoring reviewed, *** Social determinants considered, *** Critical Interventions: ***  After the interventions stated above, I reevaluated the patient and found *** Admission and further testing considered, ***   {Document critical care time when appropriate:1} {Document review of labs and clinical decision tools ie heart score, Chads2Vasc2 etc:1}  {Document your independent review of radiology images, and any outside records:1} {Document your discussion with family members, caretakers, and with consultants:1} {Document social determinants of health affecting pt's care:1} {Document your decision making why or why not admission, treatments were needed:1} Final Clinical Impression(s) / ED Diagnoses Final diagnoses:  None    Rx / DC Orders ED Discharge Orders     None

## 2022-12-23 NOTE — ED Provider Triage Note (Signed)
Emergency Medicine Provider Triage Evaluation Note  [name] , a 82 y.o. male  was evaluated in triage.  Pt complains of shortness of breath.  He has history of CHF and COPD, normally has oxygen as needed but has been using it 24/7 for the past week.  Has been using 3 L but turned it down to 2 today to conserve his oxygen tank..  Review of Systems  Positive: SOB Negative: Cough productive of white sputum  Physical Exam  BP 118/66 (BP Location: Right Arm)   Pulse 76   Temp 98.1 F (36.7 C) (Oral)   Resp 12   Ht 5\' 7"  (1.702 m)   Wt 98.4 kg   SpO2 94%   BMI 33.99 kg/m  Gen:   Awake, no distress   Resp:  Normal effort  MSK:   Moves extremities without difficulty  Other:    Medical Decision Making  Medically screening exam initiated at 4:42 PM.  Appropriate orders placed.  Chip Boer was informed that the remainder of the evaluation will be completed by another provider, this initial triage assessment does not replace that evaluation, and the importance of remaining in the ED until their evaluation is complete.     Ma Rings, [place] 12/23/22 1643

## 2022-12-23 NOTE — ED Triage Notes (Signed)
Pt via POV c/o SOB x a week and a half ago. Denies n/v/dizziness/diaphoresis. PMH includes HTN, HLD, COPD with baseline O2 requirement 3L with activity but now is requiring continuous support at 3L. Lungs clear bilaterally. Pt denies pain.

## 2022-12-23 NOTE — H&P (Signed)
History and Physical    Patient: [name] ZOX:[id number] DOB: 04-15-1941 DOA: 12/23/2022 DOS: the patient was seen and examined on 12/23/2022 PCP: Assunta Found, MD  Patient coming from: Home  Chief Complaint:  Chief Complaint  Patient presents with   Shortness of Breath   HPI: [name] is a 82 y.o. male with medical history significant of COPD on supplemental oxygen via [place] at 2 LPM as needed, CHF, hypertension, hyperlipidemia, GERD, BPH, atrial fibrillation on Eliquis who presents to the emergency department due to worsening shortness of breath that has been ongoing for about 10 days and require consistent use of oxygen at all times (change from baseline).  Shortness of breath was associated with white sputum with occasional streaks of blood and no frank hemoptysis.  Patient denies chest pain, fever, chills.  [facility] and was asked to go to the nearest ED for further evaluation.  ED Course:  In the emergency department, he was hemodynamically stable, temperature was 98.1 F, respiratory rate of 12 per minute, pulse 76 bpm, BP 118/66, O2 sat 94%.  Workup in the ED showed WBC of 10.9, hemoglobin 12.0, hematocrit 39.3, MCV 95.2, platelets 243.  BMP was normal except for sodium of 134, BUNs/creatinine 40/3.21 (most recently for comparison was on 12/10/2015 with creatinine of 1.60).  Troponin 17 > 19, BNP 293 CT chest without contrast showed emphysematous changes and fibrosis in the lungs likely account for chest radiographic findings.  No evidence of pneumonia or consolidation.  Moderate mediastinal lymphadenopathy of indeterminate etiology.  Changes may be reactive or could indicate lymphoproliferative disorder. Chest x-ray showed nodular opacities at bilateral lung bases, left greater than right, which are indeterminate and may represent pneumonia versus artifacts. Patient was treated with IV Lasix 40 mg x 1, breathing treatment  with albuterol was provided. Hospitalist was asked to admit patient for further evaluation and management.  Review of Systems: Review of systems as noted in the HPI. All other systems reviewed and are negative.   Past Medical History:  Diagnosis Date   Acid reflux    Arthritis    ? type ? RA   Asthma    Atrial fibrillation (HCC)    CAD (coronary artery disease)    COPD (chronic obstructive pulmonary disease) (HCC)    DM2 (diabetes mellitus, type 2) (HCC)    Elevated cholesterol    GERD (gastroesophageal reflux disease)    Gout    HTN (hypertension)    OSA (obstructive sleep apnea)    AHI 130 5/10. CPAP 19   Past Surgical History:  Procedure Laterality Date   ABDOMINAL SURGERY     APPENDECTOMY     COLON RESECTION     fluid of abdomen- removal of cyst 2010     right and left carpel tunnel release      Social History:  reports that he quit smoking about 44 years ago. His smoking use included cigarettes. He started smoking about 65 years ago. He has a 31.5 pack-year smoking history. He has never used smokeless tobacco. He reports that he does not drink alcohol. No history on file for drug use.   Allergies  Allergen Reactions   Codeine Other (See Comments)    Hallucinations   Lipitor [Atorvastatin]     Muscle pain    Family History  Problem Relation Age of Onset   Heart attack Brother    Emphysema Other  uncle     Prior to Admission medications   Medication Sig Start Date End Date Taking? Authorizing Provider  albuterol (PROAIR HFA) 108 (90 BASE) MCG/ACT inhaler Inhale 1-2 puffs into the lungs 2 (two) times daily. Patient taking differently: Inhale 1-2 puffs into the lungs every 4 (four) hours as needed for wheezing.  07/17/11   Storm Frisk, MD  albuterol (PROVENTIL) (2.5 MG/3ML) 0.083% nebulizer solution Take 3 mLs (2.5 mg total) by nebulization 3 (three) times daily. 07/17/11   Storm Frisk, MD  allopurinol (ZYLOPRIM) 300 MG tablet Take 300 mg by  mouth daily.    [provider]  amLODipine (NORVASC) 10 MG tablet Take 5-10 mg by mouth See admin instructions. Take one-half tablet daily.  If blood pressure is over 140/90, then take 1 whole tablet.    [provider]  apixaban (ELIQUIS) 5 MG TABS tablet Take 5 mg by mouth 2 (two) times daily.    [provider]  atorvastatin (LIPITOR) 80 MG tablet Take 40 mg by mouth at bedtime.    [provider]  carvedilol (COREG) 12.5 MG tablet Take 12.5 mg by mouth 2 (two) times daily with a meal.    [provider]  furosemide (LASIX) 40 MG tablet Take 1 tablet (40 mg total) by mouth 2 (two) times daily. 12/10/15   Benjiman Core, MD  lisinopril (PRINIVIL,ZESTRIL) 40 MG tablet Take 40 mg by mouth daily.    [provider]  Melatonin 3 MG TABS Take 1 tablet by mouth at bedtime.    [provider]  Mometasone Furo-Formoterol Fum (DULERA) 200-5 MCG/ACT AERO Inhale 2 puffs into the lungs 2 (two) times daily. 02/26/12   Storm Frisk, MD  pantoprazole (PROTONIX) 40 MG tablet Take 40 mg by mouth daily.     [provider]  potassium chloride SA (KLOR-CON M20) 20 MEQ tablet Take 1 tablet (20 mEq total) by mouth 2 (two) times daily. 12/10/15   Benjiman Core, MD  propranolol ER (INDERAL LA) 80 MG 24 hr capsule Take 80 mg by mouth daily.    [provider]  tamsulosin (FLOMAX) 0.4 MG CAPS capsule Take 0.4 mg by mouth daily after supper.    [provider]  tiotropium (SPIRIVA) 18 MCG inhalation capsule Place 18 mcg into inhaler and inhale daily.    [provider]  traZODone (DESYREL) 100 MG tablet Take 2,000 mg by mouth at bedtime as needed for sleep.    [provider]    Physical Exam: BP 116/88   Pulse (!) 53   Temp (!) 97.5 F (36.4 C) (Oral)   Resp 19   Ht 5\' 7"  (1.702 m)   Wt 98.4 kg   SpO2 98%   BMI 33.99 kg/m   General: 82 y.o. year-old male well developed well nourished in no acute  distress.  Alert and oriented x3. HEENT: NCAT, EOMI Neck: Supple, trachea medial Cardiovascular: Bradycardia.  Regular rate and rhythm with no rubs or gallops.  No thyromegaly or JVD noted.  No lower extremity edema. 2/4 pulses in all 4 extremities. Respiratory: Clear to auscultation with no wheezes or rales. Good inspiratory effort. Abdomen: Soft, nontender nondistended with normal bowel sounds x4 quadrants. Muskuloskeletal: No cyanosis, clubbing or edema noted bilaterally Neuro: CN II-XII intact, strength 5/5 x 4, sensation, reflexes intact Skin: No ulcerative lesions noted or rashes Psychiatry: Judgement and insight appear normal. Mood is appropriate for condition and setting  Labs on Admission:  Basic Metabolic Panel: Recent Labs  Lab 12/23/22 1552  NA 134*  K 4.4  CL 102  CO2 23  GLUCOSE 89  BUN 40*  CREATININE 3.21*  CALCIUM 8.6*   Liver Function Tests: No results for input(s): "AST", "ALT", "ALKPHOS", "BILITOT", "PROT", "ALBUMIN" in the last 168 hours. No results for input(s): "LIPASE", "AMYLASE" in the last 168 hours. No results for input(s): "AMMONIA" in the last 168 hours. CBC: Recent Labs  Lab 12/23/22 1552  WBC 10.9*  HGB 12.0*  HCT 39.3  MCV 95.2  PLT 243   Cardiac Enzymes: No results for input(s): "CKTOTAL", "CKMB", "CKMBINDEX", "TROPONINI" in the last 168 hours.  BNP (last 3 results) Recent Labs    12/23/22 1735  BNP 293.0*    ProBNP (last 3 results) No results for input(s): "PROBNP" in the last 8760 hours.  CBG: No results for input(s): "GLUCAP" in the last 168 hours.  Radiological Exams on Admission: CT Chest Wo Contrast  Result Date: 12/23/2022 CLINICAL DATA:  Pneumonia with complication suspected. X-ray done. Shortness of breath for 2 weeks. EXAM: CT CHEST WITHOUT CONTRAST TECHNIQUE: Multidetector CT imaging of the chest was performed following the standard protocol without IV contrast. RADIATION DOSE REDUCTION: This exam was  performed according to the departmental dose-optimization program which includes automated exposure control, adjustment of the mA and/or kV according to patient size and/or use of iterative reconstruction technique. COMPARISON:  Chest radiograph 12/23/2022 FINDINGS: Cardiovascular: Normal heart size. No pericardial effusions. Normal caliber thoracic aorta. Calcification of the aorta and coronary arteries. Mediastinum/Nodes: Thyroid gland is unremarkable. Esophagus is decompressed. Prominent mediastinal lymph nodes. Largest pretracheal lymph nodes measure 1.9 cm in short axis dimension. Lungs/Pleura: Emphysematous changes in the lungs. Scarring in the lung bases. This likely accounts for the opacity seen at prior chest radiograph. No consolidation or airspace disease. No pleural effusions. No pneumothorax. Upper Abdomen: Cholelithiasis. 5.6 cm diameter cyst in the upper pole left kidney. No imaging follow-up is indicated. Musculoskeletal: Degenerative changes in the spine. No destructive bone lesions. IMPRESSION: 1. Emphysematous changes and fibrosis in the lungs likely account for chest radiographic findings. No evidence of pneumonia or consolidation. 2. Moderate mediastinal lymphadenopathy of indeterminate etiology. Changes may be reactive or could indicate lymphoproliferative disorder. Clinical follow-up is suggested. 3. Aortic atherosclerosis. 4. Cholelithiasis. Electronically Signed   By: Burman Nieves M.D.   On: 12/23/2022 18:31   DG Chest 2 View  Result Date: 12/23/2022 CLINICAL DATA:  COPD EXAM: CHEST - 2 VIEW COMPARISON:  12/10/2015. FINDINGS: There are nodular opacities at bilateral lung bases, left greater than right, which are indeterminate and may represent pneumonia versus artifacts. Correlate clinically to determine the need for further imaging with chest CT scan. Bilateral costophrenic angles are clear. Stable cardio-mediastinal silhouette. No acute osseous abnormalities. The soft tissues are  within normal limits. IMPRESSION: *Nodular opacities at bilateral lung bases, left greater than right, which are indeterminate and may represent pneumonia versus artifacts. Electronically Signed   By: Jules Schick M.D.   On: 12/23/2022 15:34    EKG: I independently viewed the EKG done and my findings are as followed: Normal sinus rhythm with first degree AV block at a rate of 70 bpm with premature supraventricular complexes  Assessment/Plan Present on Admission:  GERD  COPD (chronic obstructive pulmonary disease) (HCC)  Persistent atrial fibrillation (HCC)  Principal Problem:   Acute exacerbation of CHF (congestive heart failure) (HCC) Active Problems:   Persistent atrial fibrillation (HCC)   COPD (  chronic obstructive pulmonary disease) (HCC)   GERD   Elevated troponin   Essential hypertension   Mixed hyperlipidemia   Obesity (BMI 30-39.9)   BPH (benign prostatic hyperplasia)  Acute exacerbation of CHF Elevated BNP Continue total input/output, daily weights and fluid restriction Continue IV Lasix 40 mg twice daily Continue heart healthy diet  Echocardiogram in the morning  [facility] so, no access to patient's other medical records  Elevated troponin possibly secondary to type II demand ischemia Troponin 17 . 19.  Patient denies chest pain, continue to trend troponin  COPD Continue albuterol, Spiriva, Dulera  Obesity (BMI 33.99) Diet and lifestyle modification   Essential hypertension Continue Coreg and lisinopril  Mixed hyperlipidemia Continue Lipitor  GERD continue Protonix  BPH Continue Flomax  Atrial fibrillation on Eliquis Continue Coreg, Eliquis   DVT prophylaxis: Eliquis   Advance Care Planning: Full code  Consults: None  Family Communication: None at bedside  Severity of Illness: The appropriate patient status for this patient is INPATIENT. Inpatient status is judged to be reasonable and necessary in order to provide the required  intensity of service to ensure the patient's safety. The patient's presenting symptoms, physical exam findings, and initial radiographic and laboratory data in the context of their chronic comorbidities is felt to place them at high risk for further clinical deterioration. Furthermore, it is not anticipated that the patient will be medically stable for discharge from the hospital within 2 midnights of admission.   * I certify that at the point of admission it is my clinical judgment that the patient will require inpatient hospital care spanning beyond 2 midnights from the point of admission due to high intensity of service, high risk for further deterioration and high frequency of surveillance required.*  Author: Frankey Shown, DO 12/23/2022 10:03 PM  For on call review [email or web address].

## 2022-12-24 ENCOUNTER — Inpatient Hospital Stay (HOSPITAL_COMMUNITY): Payer: No Typology Code available for payment source

## 2022-12-24 DIAGNOSIS — I5031 Acute diastolic (congestive) heart failure: Secondary | ICD-10-CM | POA: Diagnosis not present

## 2022-12-24 DIAGNOSIS — I10 Essential (primary) hypertension: Secondary | ICD-10-CM | POA: Diagnosis not present

## 2022-12-24 DIAGNOSIS — N400 Enlarged prostate without lower urinary tract symptoms: Secondary | ICD-10-CM | POA: Diagnosis not present

## 2022-12-24 DIAGNOSIS — I5021 Acute systolic (congestive) heart failure: Secondary | ICD-10-CM | POA: Diagnosis not present

## 2022-12-24 DIAGNOSIS — E669 Obesity, unspecified: Secondary | ICD-10-CM | POA: Diagnosis not present

## 2022-12-24 DIAGNOSIS — I4819 Other persistent atrial fibrillation: Secondary | ICD-10-CM

## 2022-12-24 LAB — COMPREHENSIVE METABOLIC PANEL
ALT: 18 U/L (ref 0–44)
AST: 22 U/L (ref 15–41)
Albumin: 3.2 g/dL — ABNORMAL LOW (ref 3.5–5.0)
Alkaline Phosphatase: 57 U/L (ref 38–126)
Anion gap: 10 (ref 5–15)
BUN: 41 mg/dL — ABNORMAL HIGH (ref 8–23)
CO2: 22 mmol/L (ref 22–32)
Calcium: 8.9 mg/dL (ref 8.9–10.3)
Chloride: 103 mmol/L (ref 98–111)
Creatinine, Ser: 3.23 mg/dL — ABNORMAL HIGH (ref 0.61–1.24)
GFR, Estimated: 19 mL/min — ABNORMAL LOW (ref >60)
Glucose, Bld: 91 mg/dL (ref 70–99)
Potassium: 4.2 mmol/L (ref 3.5–5.1)
Sodium: 135 mmol/L (ref 135–145)
Total Bilirubin: 0.9 mg/dL (ref 0.3–1.2)
Total Protein: 7.1 g/dL (ref 6.5–8.1)

## 2022-12-24 LAB — ECHOCARDIOGRAM COMPLETE
Area-P 1/2: 3.46 cm2
Height: 67 in
MV M vel: 0.97 m/s
MV Peak grad: 3.8 mmHg
S' Lateral: 3.4 cm
Weight: 3601.43 oz

## 2022-12-24 LAB — CBC
HCT: 37.9 % — ABNORMAL LOW (ref 39.0–52.0)
Hemoglobin: 11.6 g/dL — ABNORMAL LOW (ref 13.0–17.0)
MCH: 28.9 pg (ref 26.0–34.0)
MCHC: 30.6 g/dL (ref 30.0–36.0)
MCV: 94.3 fL (ref 80.0–100.0)
Platelets: 228 10*3/uL (ref 150–400)
RBC: 4.02 MIL/uL — ABNORMAL LOW (ref 4.22–5.81)
RDW: 16.2 % — ABNORMAL HIGH (ref 11.5–15.5)
WBC: 11.3 10*3/uL — ABNORMAL HIGH (ref 4.0–10.5)
nRBC: 0 % (ref 0.0–0.2)

## 2022-12-24 LAB — MAGNESIUM: Magnesium: 2 mg/dL (ref 1.7–2.4)

## 2022-12-24 LAB — PHOSPHORUS: Phosphorus: 3.7 mg/dL (ref 2.5–4.6)

## 2022-12-24 LAB — TROPONIN I (HIGH SENSITIVITY): Troponin I (High Sensitivity): 20 ng/L — ABNORMAL HIGH (ref <18)

## 2022-12-24 MED ORDER — PHENOL 1.4 % MT LIQD
1.0000 | OROMUCOSAL | Status: DC | PRN
  Administered 2022-12-24: 1 via OROMUCOSAL
  Filled 2022-12-24: qty 177

## 2022-12-24 MED ORDER — ACETAMINOPHEN 325 MG PO TABS: 650.0000 mg | ORAL_TABLET | Freq: Four times a day (QID) | ORAL | Status: DC | PRN

## 2022-12-24 MED ORDER — PERFLUTREN LIPID MICROSPHERE
1.0000 mL | INTRAVENOUS | Status: AC | PRN
  Administered 2022-12-24: 3 mL via INTRAVENOUS

## 2022-12-24 MED ORDER — ONDANSETRON HCL 4 MG/2ML IJ SOLN: 4.0000 mg | Freq: Four times a day (QID) | INTRAMUSCULAR | Status: DC | PRN

## 2022-12-24 MED ORDER — ACETAMINOPHEN 650 MG RE SUPP: 650.0000 mg | Freq: Four times a day (QID) | RECTAL | Status: DC | PRN

## 2022-12-24 MED ORDER — ONDANSETRON HCL 4 MG PO TABS: 4.0000 mg | ORAL_TABLET | Freq: Four times a day (QID) | ORAL | Status: DC | PRN

## 2022-12-24 NOTE — Progress Notes (Signed)
Patient admitted this shift. No complaints of pain. Patient ambulated to bed from wheelchair when transferred up from ED. Patient on 3L Oxygen via nasal canula continuous this shift.  Patient used urinal at bedside, and took medications without difficulty. Continued to monitor.

## 2022-12-24 NOTE — Progress Notes (Signed)
  Echocardiogram 2D Echocardiogram has been performed.  [name] 12/24/2022, 11:04 AM

## 2022-12-24 NOTE — Progress Notes (Signed)
PROGRESS NOTE  [name], is a 82 y.o. male, DOB - 1940-12-24, WGN:[id number]  Admit date - 12/23/2022   Admitting Physician Frankey Shown, DO  Outpatient Primary MD for the patient is Assunta Found, MD  LOS - 1  Chief Complaint  Patient presents with   Shortness of Breath       Brief Narrative:  82 y.o. male with medical history significant of COPD on supplemental oxygen via [place] at 2 LPM as needed, CHF, hypertension, hyperlipidemia, GERD, BPH, atrial fibrillation on Eliquis admitted on 12/23/2022 with acute on chronic hypoxic respiratory failure weight gain, dyspnea consistent with CHF exacerbation    -Assessment and Plan: 1) acute systolic dysfunction CHF--- echo from 12/24/2022 with EF of 25 to 30% with global hypokinesis of the left ventricle and grade 1 diastolic dysfunction and elevated pulmonary artery pressures -No aortic stenosis and no mitral stenosis -IV Lasix 40 mg every 12 hours -Daily weight and fluid input and output monitoring -REDs Vest -Continue Coreg, lisinopril -No prior echo to compare with---patient goes to [facility] -Official cardiology consult requested  2)COPD--- CT chest from 12/23/2022 with emphysema and fibrosis, no pneumonia -Continue bronchodilators -Hold off on steroids  3)PAFib----continue Eliquis for stroke prophylaxis  4)BPH--continue tamsulosin  5) acute on chronic hypoxic respiratory failure due to #1 and #2 above -at Baseline patient uses 2 L of oxygen ventricular cannula -Currently requiring 4 L--- anticipate that this will improve with diuresis as above #1  6) class II obesity- -Low calorie diet, portion control and increase physical activity discussed with patient -Body mass index is 35.25 kg/m.   Status is: Inpatient   Disposition: The patient is from: Home              Anticipated d/c is to: Home              Anticipated d/c date is: 2 days              Patient currently is not medically stable to d/c. Barriers: Not  Clinically Stable-   Code Status :  -  Code Status: Full Code   Family Communication:    (patient is alert, awake and coherent)  Discussed with patient's wife  DVT Prophylaxis  :   - SCDs  SCDs Start: 12/24/22 0519 apixaban (ELIQUIS) tablet 2.5 mg Start: 12/23/22 2315 apixaban (ELIQUIS) tablet 2.5 mg   Lab Results  Component Value Date   PLT 228 12/24/2022   Inpatient Medications  Scheduled Meds:  albuterol  2.5 mg Nebulization TID   apixaban  2.5 mg Oral BID   carvedilol  12.5 mg Oral BID WC   furosemide  40 mg Intravenous Q12H   lisinopril  40 mg Oral Daily   melatonin  3 mg Oral QHS   mometasone-formoterol  2 puff Inhalation BID   pantoprazole  40 mg Oral Daily   tamsulosin  0.4 mg Oral QPC supper   umeclidinium bromide  1 puff Inhalation Daily   Continuous Infusions: PRN Meds:.acetaminophen **OR** acetaminophen, albuterol, ondansetron **OR** ondansetron (ZOFRAN) IV   Anti-infectives (From admission, onward)    None       Subjective: [name] today has no fevers, no emesis,  No chest pain,   - Patient's wife visited patient -Questions answered -Dyspnea on exertion persist -Voiding well without dysuria or hematuria  Objective: Vitals:   12/24/22 0100 12/24/22 0438 12/24/22 0518 12/24/22 1410  BP:  121/79  (!) 101/59  Pulse:  72  65  Resp:  18  Temp:  98.5 F (36.9 C)  98.2 F (36.8 C)  TempSrc:    Oral  SpO2:  95% 95% 97%  Weight: 102.1 kg     Height:        Intake/Output Summary (Last 24 hours) at 12/24/2022 1717 Last data filed at 12/24/2022 1421 Gross per 24 hour  Intake 370 ml  Output 1800 ml  Net -1430 ml   Filed Weights   12/23/22 1444 12/24/22 0100  Weight: 98.4 kg 102.1 kg    Physical Exam  Gen:- Awake Alert, dyspnea on exertion, no conversational dyspnea HEENT:- [place].AT, No sclera icterus Nose-  4L/min Neck-Supple Neck, +ve JVD,.  Lungs-faint bibasilar Rales, no wheezing, air movement is fair CV- S1, S2 normal, regular   Abd-  +ve B.Sounds, Abd Soft, No tenderness,    Extremity/Skin:- trace  edema, pedal pulses present  Psych-affect is appropriate, oriented x3 Neuro-no new focal deficits, no tremors  Data Reviewed: I have personally reviewed following labs and imaging studies  CBC: Recent Labs  Lab 12/23/22 1552 12/24/22 0556  WBC 10.9* 11.3*  HGB 12.0* 11.6*  HCT 39.3 37.9*  MCV 95.2 94.3  PLT 243 228   Basic Metabolic Panel: Recent Labs  Lab 12/23/22 1552 12/24/22 0556  NA 134* 135  K 4.4 4.2  CL 102 103  CO2 23 22  GLUCOSE 89 91  BUN 40* 41*  CREATININE 3.21* 3.23*  CALCIUM 8.6* 8.9  MG  --  2.0  PHOS  --  3.7   GFR: Estimated Creatinine Clearance: 20.4 mL/min (A) (by C-G formula based on SCr of 3.23 mg/dL (H)). Liver Function Tests: Recent Labs  Lab 12/24/22 0556  AST 22  ALT 18  ALKPHOS 57  BILITOT 0.9  PROT 7.1  ALBUMIN 3.2*   Radiology Studies: ECHOCARDIOGRAM COMPLETE  Result Date: 12/24/2022    ECHOCARDIOGRAM REPORT   Patient Name:   [name] Date of Exam: 12/24/2022 Medical Rec #:  [id number]   Height:       67.0 in Accession #:    [id number]  Weight:       225.1 lb Date of Birth:  Mar 02, 1941   BSA:          2.126 m Patient Age:    81 years    BP:           121/79 mmHg Patient Gender: M           HR:           71 bpm. [facility] Procedure: 2D Echo, Intracardiac Opacification Agent, Cardiac Doppler and Color            Doppler Indications:    CHF-Acute Diastolic I50.31  History:        Patient has no prior history of Echocardiogram examinations.                 CHF, COPD, Arrythmias:Atrial Fibrillation; Risk                 Factors:Hypertension, Dyslipidemia, Former Smoker and Sleep                 Apnea.  Sonographer:    Aron Baba Referring Phys: [id number] OLADAPO ADEFESO  Sonographer Comments: Patient is obese and Technically difficult study due to poor echo windows. IMPRESSIONS  1. Left ventricular ejection fraction, by estimation, is 25 to 30%. The  left ventricle has moderate to severely decreased function. The left ventricle demonstrates global hypokinesis. Left  ventricular diastolic parameters are consistent with Grade I diastolic dysfunction (impaired relaxation).  2. Right ventricular systolic function is severely reduced. The right ventricular size is moderately enlarged. There is moderately elevated pulmonary artery systolic pressure. The estimated right ventricular systolic pressure is 51.7 mmHg.  3. Right atrial size was severely dilated.  4. The mitral valve is normal in structure. No evidence of mitral valve regurgitation. No evidence of mitral stenosis.  5. The aortic valve is tricuspid. Aortic valve regurgitation is not visualized. No aortic stenosis is present.  6. The inferior vena cava is normal in size with greater than 50% respiratory variability, suggesting right atrial pressure of 3 mmHg. Comparison(s): No prior Echocardiogram. FINDINGS  Left Ventricle: Left ventricular ejection fraction, by estimation, is 25 to 30%. The left ventricle has moderate to severely decreased function. The left ventricle demonstrates global hypokinesis. Definity contrast agent was given IV to delineate the left ventricular endocardial borders. The left ventricular internal cavity size was normal in size. There is no left ventricular hypertrophy. Left ventricular diastolic parameters are consistent with Grade I diastolic dysfunction (impaired relaxation). Right Ventricle: The right ventricular size is moderately enlarged. No increase in right ventricular wall thickness. Right ventricular systolic function is severely reduced. There is moderately elevated pulmonary artery systolic pressure. The tricuspid regurgitant velocity is 3.49 m/s, and with an assumed right atrial pressure of 3 mmHg, the estimated right ventricular systolic pressure is 51.7 mmHg. Left Atrium: Left atrial size was normal in size. Right Atrium: Right atrial size was severely dilated.  Pericardium: There is no evidence of pericardial effusion. Mitral Valve: The mitral valve is normal in structure. No evidence of mitral valve regurgitation. No evidence of mitral valve stenosis. Tricuspid Valve: The tricuspid valve is not well visualized. Tricuspid valve regurgitation is trivial. No evidence of tricuspid stenosis. Aortic Valve: The aortic valve is tricuspid. Aortic valve regurgitation is not visualized. No aortic stenosis is present. Pulmonic Valve: The pulmonic valve was not well visualized. Pulmonic valve regurgitation is trivial. No evidence of pulmonic stenosis. Aorta: The aortic root and ascending aorta are structurally normal, with no evidence of dilitation. Venous: The inferior vena cava is normal in size with greater than 50% respiratory variability, suggesting right atrial pressure of 3 mmHg. IAS/Shunts: No atrial level shunt detected by color flow Doppler.  LEFT VENTRICLE PLAX 2D LVIDd:         3.80 cm   Diastology LVIDs:         3.40 cm   LV e' medial:    2.78 cm/s LV PW:         1.00 cm   LV E/e' medial:  16.6 LV IVS:        0.70 cm   LV e' lateral:   4.58 cm/s LVOT diam:     2.40 cm   LV E/e' lateral: 10.1 LV SV:         50 LV SV Index:   24 LVOT Area:     4.52 cm  RIGHT VENTRICLE RV S prime:     9.48 cm/s TAPSE (M-mode): 2.5 cm LEFT ATRIUM             Index        RIGHT ATRIUM           Index LA diam:        3.80 cm 1.79 cm/m   RA Area:     32.10 cm LA Vol (A2C):   72.2 ml 33.95 ml/m  RA Volume:  123.00 ml 57.84 ml/m LA Vol (A4C):   57.6 ml 27.09 ml/m LA Biplane Vol: 65.4 ml 30.76 ml/m  AORTIC VALVE             PULMONIC VALVE LVOT Vmax:   67.00 cm/s  PR End Diast Vel: 4.67 msec LVOT Vmean:  42.350 cm/s LVOT VTI:    0.111 m  AORTA Ao Root diam: 3.60 cm Ao Asc diam:  3.50 cm MITRAL VALVE               TRICUSPID VALVE MV Area (PHT): 3.46 cm    TR Peak grad:   48.7 mmHg MV Decel Time: 219 msec    TR Vmax:        349.00 cm/s MR Peak grad: 3.8 mmHg MR Vmax:      97.00 cm/s   SHUNTS  MV E velocity: 46.10 cm/s  Systemic VTI:  0.11 m MV A velocity: 81.00 cm/s  Systemic Diam: 2.40 cm MV E/A ratio:  0.57 Vishnu Priya Mallipeddi Electronically signed by Winfield Rast Mallipeddi Signature Date/Time: 12/24/2022/12:12:13 PM    Final    CT Chest Wo Contrast  Result Date: 12/23/2022 CLINICAL DATA:  Pneumonia with complication suspected. X-ray done. Shortness of breath for 2 weeks. EXAM: CT CHEST WITHOUT CONTRAST TECHNIQUE: Multidetector CT imaging of the chest was performed following the standard protocol without IV contrast. RADIATION DOSE REDUCTION: This exam was performed according to the departmental dose-optimization program which includes automated exposure control, adjustment of the mA and/or kV according to patient size and/or use of iterative reconstruction technique. COMPARISON:  Chest radiograph 12/23/2022 FINDINGS: Cardiovascular: Normal heart size. No pericardial effusions. Normal caliber thoracic aorta. Calcification of the aorta and coronary arteries. Mediastinum/Nodes: Thyroid gland is unremarkable. Esophagus is decompressed. Prominent mediastinal lymph nodes. Largest pretracheal lymph nodes measure 1.9 cm in short axis dimension. Lungs/Pleura: Emphysematous changes in the lungs. Scarring in the lung bases. This likely accounts for the opacity seen at prior chest radiograph. No consolidation or airspace disease. No pleural effusions. No pneumothorax. Upper Abdomen: Cholelithiasis. 5.6 cm diameter cyst in the upper pole left kidney. No imaging follow-up is indicated. Musculoskeletal: Degenerative changes in the spine. No destructive bone lesions. IMPRESSION: 1. Emphysematous changes and fibrosis in the lungs likely account for chest radiographic findings. No evidence of pneumonia or consolidation. 2. Moderate mediastinal lymphadenopathy of indeterminate etiology. Changes may be reactive or could indicate lymphoproliferative disorder. Clinical follow-up is suggested. 3. Aortic  atherosclerosis. 4. Cholelithiasis. Electronically Signed   By: Burman Nieves M.D.   On: 12/23/2022 18:31   DG Chest 2 View  Result Date: 12/23/2022 CLINICAL DATA:  COPD EXAM: CHEST - 2 VIEW COMPARISON:  12/10/2015. FINDINGS: There are nodular opacities at bilateral lung bases, left greater than right, which are indeterminate and may represent pneumonia versus artifacts. Correlate clinically to determine the need for further imaging with chest CT scan. Bilateral costophrenic angles are clear. Stable cardio-mediastinal silhouette. No acute osseous abnormalities. The soft tissues are within normal limits. IMPRESSION: *Nodular opacities at bilateral lung bases, left greater than right, which are indeterminate and may represent pneumonia versus artifacts. Electronically Signed   By: Jules Schick M.D.   On: 12/23/2022 15:34    Scheduled Meds:  albuterol  2.5 mg Nebulization TID   apixaban  2.5 mg Oral BID   carvedilol  12.5 mg Oral BID WC   furosemide  40 mg Intravenous Q12H   lisinopril  40 mg Oral Daily   melatonin  3 mg Oral QHS  mometasone-formoterol  2 puff Inhalation BID   pantoprazole  40 mg Oral Daily   tamsulosin  0.4 mg Oral QPC supper   umeclidinium bromide  1 puff Inhalation Daily   Continuous Infusions:   LOS: 1 day   Shon Hale M.D on 12/24/2022 at 5:17 PM  Go to [email or web address] - for contact info  [facility] - Office  [phone]  If 7PM-7AM, please contact night-coverage [email or web address] 12/24/2022, 5:17 PM

## 2022-12-24 NOTE — Progress Notes (Signed)
Mobility Specialist Progress Note:    12/24/22 1218  Mobility  Activity Ambulated with assistance in hallway  Level of Assistance Minimal assist, patient does 75% or more  Assistive Device Front wheel walker  Distance Ambulated (ft) 60 ft  Range of Motion/Exercises Active;All extremities  Activity Response Tolerated well  Mobility Referral Yes  $Mobility charge 1 Mobility  Mobility Specialist Start Time (ACUTE ONLY) 1200  Mobility Specialist Stop Time (ACUTE ONLY) 1218  Mobility Specialist Time Calculation (min) (ACUTE ONLY) 18 min   Pt received dangling EOB, wife in room, agreeable to mobility session. Ambulated in hallway via RW. Tolerated well, audible SOB throughout. SpO2 88% on 3L during ambulation. Took 1 standing rest break and returned to room. Pt recovered, SpO2 95% on 3L. Left pt at EOB, all needs met, call bell in reach.  Feliciana Rossetti Mobility Specialist Please contact via Special educational needs teacher or  Rehab office at [phone]

## 2022-12-24 NOTE — Progress Notes (Signed)
   12/24/22 1442  ReDS Vest / Clip  Station Marker D  Ruler Value 38  ReDS Value Range (!) > 40  ReDS Actual Value 50

## 2022-12-24 NOTE — Progress Notes (Signed)
   12/24/22 1316  TOC Brief Assessment  Insurance and Status Reviewed  Patient has primary care physician Yes  Home environment has been reviewed yes  Prior level of function: assistive device for ambulation  Social Determinants of Health Reivew SDOH reviewed no interventions necessary  Readmission risk has been reviewed Yes  Transition of care needs no transition of care needs at this time   Transition of Care Department [facility]) has reviewed patient and no TOC needs have been identified at this time. We will continue to monitor patient advancement through interdisciplinary progression rounds. If new patient transition needs arise, please place a TOC consult.

## 2022-12-25 DIAGNOSIS — I5082 Biventricular heart failure: Secondary | ICD-10-CM

## 2022-12-25 DIAGNOSIS — I10 Essential (primary) hypertension: Secondary | ICD-10-CM | POA: Diagnosis not present

## 2022-12-25 DIAGNOSIS — G4733 Obstructive sleep apnea (adult) (pediatric): Secondary | ICD-10-CM

## 2022-12-25 DIAGNOSIS — I5021 Acute systolic (congestive) heart failure: Secondary | ICD-10-CM | POA: Diagnosis not present

## 2022-12-25 DIAGNOSIS — I5041 Acute combined systolic (congestive) and diastolic (congestive) heart failure: Secondary | ICD-10-CM

## 2022-12-25 DIAGNOSIS — I4819 Other persistent atrial fibrillation: Secondary | ICD-10-CM | POA: Diagnosis not present

## 2022-12-25 DIAGNOSIS — I480 Paroxysmal atrial fibrillation: Secondary | ICD-10-CM

## 2022-12-25 DIAGNOSIS — N400 Enlarged prostate without lower urinary tract symptoms: Secondary | ICD-10-CM | POA: Diagnosis not present

## 2022-12-25 LAB — RENAL FUNCTION PANEL
Albumin: 3.3 g/dL — ABNORMAL LOW (ref 3.5–5.0)
Anion gap: 9 (ref 5–15)
BUN: 43 mg/dL — ABNORMAL HIGH (ref 8–23)
CO2: 24 mmol/L (ref 22–32)
Calcium: 9.1 mg/dL (ref 8.9–10.3)
Chloride: 102 mmol/L (ref 98–111)
Creatinine, Ser: 3.19 mg/dL — ABNORMAL HIGH (ref 0.61–1.24)
GFR, Estimated: 19 mL/min — ABNORMAL LOW (ref >60)
Glucose, Bld: 113 mg/dL — ABNORMAL HIGH (ref 70–99)
Phosphorus: 3.9 mg/dL (ref 2.5–4.6)
Potassium: 4.3 mmol/L (ref 3.5–5.1)
Sodium: 135 mmol/L (ref 135–145)

## 2022-12-25 MED ORDER — TRAZODONE HCL 50 MG PO TABS
25.0000 mg | ORAL_TABLET | Freq: Every day | ORAL | Status: DC
  Administered 2022-12-25 – 2022-12-26 (×2): 25 mg via ORAL
  Filled 2022-12-25 (×2): qty 1

## 2022-12-25 MED ORDER — DICLOFENAC SODIUM 1 % EX GEL
2.0000 g | Freq: Four times a day (QID) | CUTANEOUS | Status: DC
  Administered 2022-12-25 – 2022-12-27 (×5): 2 g via TOPICAL
  Filled 2022-12-25: qty 100

## 2022-12-25 MED ORDER — FUROSEMIDE 10 MG/ML IJ SOLN
60.0000 mg | Freq: Two times a day (BID) | INTRAMUSCULAR | Status: DC
  Administered 2022-12-25 – 2022-12-26 (×2): 60 mg via INTRAVENOUS
  Filled 2022-12-25 (×2): qty 6

## 2022-12-25 NOTE — Consult Note (Addendum)
Cardiology Consultation   Patient ID: [name] MRN: [id number]; DOB: 09-11-1940  Admit date: 12/23/2022 Date of Consult: 12/25/2022  PCP:  Assunta Found, MD   [facility] Providers Cardiologist:  [facility]   Patient Profile:   [name] is a 82 y.o. male with a hx of CAD, HFrEF (listed in [place] notes from 07/2022 but echo unavailable), paroxysmal atrial fibrillation, HTN, HLD, Type 2 DM, COPD, OSA and Stage 4 CKD who is being seen 12/25/2022 for the evaluation of CHF at the request of Dr. Mariea Clonts.  History of Present Illness:   Mr. Wadding presented to [facility] ED on 12/23/2022 for evaluation of worsening dyspnea on exertion over the past 10 days. In talking with the patient today, he reports having worsening dyspnea on exertion for several days leading up to admission and noticed this mostly when walking around his apartment. He reports orthopnea and PND as well but uses supplemental oxygen at night as he was previously intolerant to CPAP. Has been using his oxygen more during the day. Denies any recent chest pain or palpitations. Says they have been consuming more fast food recently. He was aware of his prior history of atrial fibrillation but is unaware of any known history of CAD or CHF. No prior CABG or coronary stent placement. Says he was previously on dialysis but was able to stop this approximately 8 months ago. He is followed by cardiology and nephrology at the [facility].   Initial labs this admission show WBC 10.9, Hgb 12.0, platelets 243, Na+ 134, K+ 4.4 and creatinine 3.21 (no recent values available for comparison but was at 3.0 in 01/2022 per Care Everywhere). BNP 293. Initial and repeat Hs Troponin values flat at 17, 19, 19 and 20. CXR showed nodular opacities at the bilateral lung bases which may represent pneumonia versus artifact. CT Chest showed emphysematous changes and fibrosis in the lungs with no evidence of pneumonia or consolidation.  Was also noted to have  moderate mediastinal lymphadenopathy with follow-up imaging recommended. EKG showed NSR, HR 70 with 1st degree AV Block and PAC's. No significant ST abnormalities.   He was admitted for an acute CHF exacerbation and started on IV Lasix 40 mg twice daily. He has a net output of -1.7 L thus far and weight is listed as having declined from 225 lbs to 220 lbs. ReDS Vest was at 50 yesterday. Echocardiogram imaging this admission shows a reduced EF of 25 to 30% with global hypokinesis, grade 1 diastolic dysfunction, severely reduced RV function, moderately elevated PASP at 51.7 mmHg and no significant valve normalities.  Past Medical History:  Diagnosis Date   Acid reflux    Arthritis    ? type ? RA   Asthma    Atrial fibrillation (HCC)    CAD (coronary artery disease)    COPD (chronic obstructive pulmonary disease) (HCC)    DM2 (diabetes mellitus, type 2) (HCC)    Elevated cholesterol    GERD (gastroesophageal reflux disease)    Gout    HTN (hypertension)    OSA (obstructive sleep apnea)    AHI 130 5/10. CPAP 19    Past Surgical History:  Procedure Laterality Date   ABDOMINAL SURGERY     APPENDECTOMY     COLON RESECTION     fluid of abdomen- removal of cyst 2010     right and left carpel tunnel release       Home Medications:  Prior to Admission medications  Medication Sig Start Date End Date Taking? Authorizing Provider  albuterol (PROAIR HFA) 108 (90 BASE) MCG/ACT inhaler Inhale 1-2 puffs into the lungs 2 (two) times daily. Patient taking differently: Inhale 1-2 puffs into the lungs every 4 (four) hours as needed for wheezing.  07/17/11   Storm Frisk, MD  albuterol (PROVENTIL) (2.5 MG/3ML) 0.083% nebulizer solution Take 3 mLs (2.5 mg total) by nebulization 3 (three) times daily. 07/17/11   Storm Frisk, MD  allopurinol (ZYLOPRIM) 300 MG tablet Take 300 mg by mouth daily.    [provider]  amLODipine (NORVASC) 10 MG tablet Take 5-10 mg by mouth See admin  instructions. Take one-half tablet daily.  If blood pressure is over 140/90, then take 1 whole tablet.    [provider]  apixaban (ELIQUIS) 5 MG TABS tablet Take 5 mg by mouth 2 (two) times daily.    [provider]  atorvastatin (LIPITOR) 80 MG tablet Take 40 mg by mouth at bedtime.    [provider]  carvedilol (COREG) 12.5 MG tablet Take 12.5 mg by mouth 2 (two) times daily with a meal.    [provider]  furosemide (LASIX) 40 MG tablet Take 1 tablet (40 mg total) by mouth 2 (two) times daily. 12/10/15   Benjiman Core, MD  lisinopril (PRINIVIL,ZESTRIL) 40 MG tablet Take 40 mg by mouth daily.    [provider]  Melatonin 3 MG TABS Take 1 tablet by mouth at bedtime.    [provider]  Mometasone Furo-Formoterol Fum (DULERA) 200-5 MCG/ACT AERO Inhale 2 puffs into the lungs 2 (two) times daily. 02/26/12   Storm Frisk, MD  pantoprazole (PROTONIX) 40 MG tablet Take 40 mg by mouth daily.     [provider]  potassium chloride SA (KLOR-CON M20) 20 MEQ tablet Take 1 tablet (20 mEq total) by mouth 2 (two) times daily. 12/10/15   Benjiman Core, MD  propranolol ER (INDERAL LA) 80 MG 24 hr capsule Take 80 mg by mouth daily.    [provider]  tamsulosin (FLOMAX) 0.4 MG CAPS capsule Take 0.4 mg by mouth daily after supper.    [provider]  tiotropium (SPIRIVA) 18 MCG inhalation capsule Place 18 mcg into inhaler and inhale daily.    [provider]  traZODone (DESYREL) 100 MG tablet Take 2,000 mg by mouth at bedtime as needed for sleep.    [provider]    Inpatient Medications: Scheduled Meds:  albuterol  2.5 mg Nebulization TID   apixaban  2.5 mg Oral BID   carvedilol  12.5 mg Oral BID WC   furosemide  40 mg Intravenous Q12H   lisinopril  40 mg Oral Daily   melatonin  3 mg Oral QHS   mometasone-formoterol  2 puff Inhalation BID   pantoprazole  40 mg Oral Daily   tamsulosin  0.4 mg  Oral QPC supper   umeclidinium bromide  1 puff Inhalation Daily   Continuous Infusions:  PRN Meds: acetaminophen **OR** acetaminophen, albuterol, ondansetron **OR** ondansetron (ZOFRAN) IV, phenol  Allergies:    Allergies  Allergen Reactions   Codeine Other (See Comments)    Hallucinations   Lipitor [Atorvastatin]     Muscle pain    Social History:   Social History   Socioeconomic History   Marital status: Married    Spouse name: Not on file   Number of children: Not on file   Years of education: Not on file   Highest education  level: Not on file  Occupational History   Not on file  Tobacco Use   Smoking status: Former    Current packs/day: 0.00    Average packs/day: 1.5 packs/day for 21.0 years (31.5 ttl pk-yrs)    Types: Cigarettes    Start date: 06/10/1957    Quit date: 06/10/1978    Years since quitting: 44.5   Smokeless tobacco: Never  Substance and Sexual Activity   Alcohol use: No   Drug use: Not on file   Sexual activity: Not on file  Other Topics Concern   Not on file  Social History Narrative   Married - lives with wife. Children, 1 dog. Employed at [employer].    Social Determinants of Health   Financial Resource Strain: Not on file  Food Insecurity: No Food Insecurity (12/23/2022)   Hunger Vital Sign    Worried About Running Out of Food in the Last Year: Never true    Ran Out of Food in the Last Year: Never true  Transportation Needs: No Transportation Needs (12/23/2022)   PRAPARE - Administrator, Civil Service (Medical): No    Lack of Transportation (Non-Medical): No  Physical Activity: Not on file  Stress: Not on file  Social Connections: Unknown (10/23/2021)   [facility] from [facility], [facility], [facility]   Social Network    Social Network: Not on file  Intimate Partner Violence: Not At Risk (12/23/2022)   Humiliation, Afraid, Rape, and Kick questionnaire    Fear of Current or Ex-Partner: No    Emotionally Abused: No    Physically Abused: No     Sexually Abused: No    Family History:    Family History  Problem Relation Age of Onset   Heart attack Brother    Emphysema Other        uncle     ROS:  Please see the history of present illness.   All other ROS reviewed and negative.     Physical Exam/Data:   Vitals:   12/25/22 0454 12/25/22 0500 12/25/22 0700 12/25/22 0833  BP: (!) 103/58   100/64  Pulse: 77     Resp: 18   19  Temp: 98.7 F (37.1 C)     TempSrc: Oral     SpO2: 98%  99% 94%  Weight:  100.1 kg    Height:        Intake/Output Summary (Last 24 hours) at 12/25/2022 1010 Last data filed at 12/25/2022 0900 Gross per 24 hour  Intake 610 ml  Output 1300 ml  Net -690 ml      12/25/2022    5:00 AM 12/24/2022    1:00 AM 12/23/2022    2:44 PM  Last 3 Weights  Weight (lbs) 220 lb 10.9 oz 225 lb 1.4 oz 217 lb  Weight (kg) 100.1 kg 102.1 kg 98.431 kg     Body mass index is 34.56 kg/m.  General:  Well nourished, well developed male appearing in no acute distress HEENT: normal Neck: no JVD Vascular: No carotid bruits; Distal pulses 2+ bilaterally Cardiac:  normal S1, S2; RRR with frequent ectopic beats.  Lungs: rales along bases bilaterally.  Abd: appears distended.  Ext: 1+ pitting edema along LLE, trace edema along RLE.  Musculoskeletal:  No deformities, BUE and BLE strength normal and equal Skin: warm and dry  Neuro:  CNs 2-12 intact, no focal abnormalities noted Psych:  Normal affect   EKG:  The EKG was personally reviewed and demonstrates: NSR, HR 70 with  1st degree AV Block and PAC's. No significant ST abnormalities.  Telemetry:  Telemetry was personally reviewed and demonstrates: NSR, HR in 70's to 80's with frequent PAC's, PVC's and episodes of ventricular bigeminy.  Relevant CV Studies:  Echocardiogram: 12/24/2022 IMPRESSIONS     1. Left ventricular ejection fraction, by estimation, is 25 to 30%. The  left ventricle has moderate to severely decreased function. The left  ventricle  demonstrates global hypokinesis. Left ventricular diastolic  parameters are consistent with Grade I  diastolic dysfunction (impaired relaxation).   2. Right ventricular systolic function is severely reduced. The right  ventricular size is moderately enlarged. There is moderately elevated  pulmonary artery systolic pressure. The estimated right ventricular  systolic pressure is 51.7 mmHg.   3. Right atrial size was severely dilated.   4. The mitral valve is normal in structure. No evidence of mitral valve  regurgitation. No evidence of mitral stenosis.   5. The aortic valve is tricuspid. Aortic valve regurgitation is not  visualized. No aortic stenosis is present.   6. The inferior vena cava is normal in size with greater than 50%  respiratory variability, suggesting right atrial pressure of 3 mmHg.   Comparison(s): No prior Echocardiogram.   Laboratory Data:  High Sensitivity Troponin:   Recent Labs  Lab 12/23/22 1735 12/23/22 2011 12/23/22 2222 12/24/22 0002  TROPONINIHS 17 19* 19* 20*     Chemistry Recent Labs  Lab 12/23/22 1552 12/24/22 0556 12/25/22 0421  NA 134* 135 135  K 4.4 4.2 4.3  CL 102 103 102  CO2 23 22 24   GLUCOSE 89 91 113*  BUN 40* 41* 43*  CREATININE 3.21* 3.23* 3.19*  CALCIUM 8.6* 8.9 9.1  MG  --  2.0  --   GFRNONAA 19* 19* 19*  ANIONGAP 9 10 9     Recent Labs  Lab 12/24/22 0556 12/25/22 0421  PROT 7.1  --   ALBUMIN 3.2* 3.3*  AST 22  --   ALT 18  --   ALKPHOS 57  --   BILITOT 0.9  --    Lipids No results for input(s): "CHOL", "TRIG", "HDL", "LABVLDL", "LDLCALC", "CHOLHDL" in the last 168 hours.  Hematology Recent Labs  Lab 12/23/22 1552 12/24/22 0556  WBC 10.9* 11.3*  RBC 4.13* 4.02*  HGB 12.0* 11.6*  HCT 39.3 37.9*  MCV 95.2 94.3  MCH 29.1 28.9  MCHC 30.5 30.6  RDW 16.4* 16.2*  PLT 243 228   Thyroid No results for input(s): "TSH", "FREET4" in the last 168 hours.  BNP Recent Labs  Lab 12/23/22 1735  BNP 293.0*    DDimer  No results for input(s): "DDIMER" in the last 168 hours.   Radiology/Studies:    CT Chest Wo Contrast  Result Date: 12/23/2022 CLINICAL DATA:  Pneumonia with complication suspected. X-ray done. Shortness of breath for 2 weeks. EXAM: CT CHEST WITHOUT CONTRAST TECHNIQUE: Multidetector CT imaging of the chest was performed following the standard protocol without IV contrast. RADIATION DOSE REDUCTION: This exam was performed according to the departmental dose-optimization program which includes automated exposure control, adjustment of the mA and/or kV according to patient size and/or use of iterative reconstruction technique. COMPARISON:  Chest radiograph 12/23/2022 FINDINGS: Cardiovascular: Normal heart size. No pericardial effusions. Normal caliber thoracic aorta. Calcification of the aorta and coronary arteries. Mediastinum/Nodes: Thyroid gland is unremarkable. Esophagus is decompressed. Prominent mediastinal lymph nodes. Largest pretracheal lymph nodes measure 1.9 cm in short axis dimension. Lungs/Pleura: Emphysematous changes in the lungs. Scarring in the  lung bases. This likely accounts for the opacity seen at prior chest radiograph. No consolidation or airspace disease. No pleural effusions. No pneumothorax. Upper Abdomen: Cholelithiasis. 5.6 cm diameter cyst in the upper pole left kidney. No imaging follow-up is indicated. Musculoskeletal: Degenerative changes in the spine. No destructive bone lesions. IMPRESSION: 1. Emphysematous changes and fibrosis in the lungs likely account for chest radiographic findings. No evidence of pneumonia or consolidation. 2. Moderate mediastinal lymphadenopathy of indeterminate etiology. Changes may be reactive or could indicate lymphoproliferative disorder. Clinical follow-up is suggested. 3. Aortic atherosclerosis. 4. Cholelithiasis. Electronically Signed   By: Burman Nieves M.D.   On: 12/23/2022 18:31   DG Chest 2 View  Result Date: 12/23/2022 CLINICAL DATA:   COPD EXAM: CHEST - 2 VIEW COMPARISON:  12/10/2015. FINDINGS: There are nodular opacities at bilateral lung bases, left greater than right, which are indeterminate and may represent pneumonia versus artifacts. Correlate clinically to determine the need for further imaging with chest CT scan. Bilateral costophrenic angles are clear. Stable cardio-mediastinal silhouette. No acute osseous abnormalities. The soft tissues are within normal limits. IMPRESSION: *Nodular opacities at bilateral lung bases, left greater than right, which are indeterminate and may represent pneumonia versus artifacts. Electronically Signed   By: Jules Schick M.D.   On: 12/23/2022 15:34     Assessment and Plan:   1. Acute HFrEF/RV Failure - Currently admitted with an acute CHF exacerbation with BNP elevated to 293 and echocardiogram shows a reduced EF of 25 to 30% along with severely reduced RV function. We will obtain records from the [place] to see if this is a new diagnosis or has been documented in the past. - At this time, he is not a candidate for a cardiac catheterization given the high risk of contrast-induced nephropathy in the setting of Stage IV CKD. Therefore, would plan for medical therapy. He is currently responding well to IV Lasix 40 mg twice daily and would continue given his ReDS vest was elevated at 50 when checked yesterday. - He is currently on Coreg 12.5 mg twice daily and Lisinopril 40 mg daily. Would defer continuation of ACE-I to Nephrology. Would not switch to [employer] or start an MRA at this time given his renal function. Would also defer initiation of SGLT2 inhibitor to Nephrology. Would likely be a candidate for Hydralazine/Nitrates but BP was soft at 100/64 when checked earlier. Will review with MD but perhaps Coreg can be reduced or switched to Toprol-XL to allow for the addition of Hydralazine/Nitrates.   2. CAD - Details currently unavailable and will request records. He denies any history of CABG or  stent placement. No recent chest pain. Hs Troponin values were flat at 17, 19, 19 and 20 this admission which is not consistent with ACS.  - While his EF is reduced, he is not a candidate for a cardiac catheterization as discussed above.  Continue with medical therapy.  3. Paroxysmal Atrial Fibrillation - Maintaining NSR this admission. Remains on Coreg 12.5mg  BID.  - He is on Eliquis for anticoagulation and dosing has been adjusted to 2.5mg  BID given his age and renal function.   4. HLD - Followed by his PCP. He has been continued on Atorvastatin 40 mg daily.   5. OSA - Previously intolerant to CPAP and uses supplemental oxygen at night.   6. Stage 4 CKD - He reports previously requiring dialysis but has been off this for at least 8 months. His creatinine was at 3.0 in 01/2022 and has been  stable at 3.19 to 3.23 this admission. Continue to follow with diuresis.    For questions or updates, please contact [facility] Please consult [email or web address] for contact info under    Signed, Ellsworth Lennox, PA-C  12/25/2022 10:10 AM     Attending attestation  Patient seen and independently examined with Randall An, PA-C. We discussed all aspects of the encounter. I agree with the assessment and plan as stated above.  Patient is a 82 year old M known to have paroxysmal A-fib, HTN, DM 2, HLD, COPD, OSA, stage IV CKD presented to the ER with DOE and bilateral lower EXTR swelling x 10 days prior to presentation.  EKG on admission showed NSR, first-degree AV block.  Echocardiogram on admission showed LVEF 25 to 30% and severe RV dysfunction. He is currently on the hospitalist team for the management of ADHF on IV diuresis.  He walked in the hallway and continues to have symptoms of SOB.  He has a history of HFrEF (the [place] notes but patient was not aware about it. He also had temporary hemodialysis in the past which was discontinued 8 months ago.  Physical examination is remarkable for  patient not in acute respite distress, JVD unable to examine due to body habitus, HEENT normal, S1-S2 normal, no murmur, clear lungs, abdomen distended (chronic), nontender, trace pitting edema in bilateral lower extremities, AAOx3.  For ADHF/acute systolic heart failure exacerbation/biventricular heart failure in the setting of CKD stage IV: Increase IV Lasix from 40 mg to 60 mg twice daily, continue carvedilol 12.5 mg twice daily, lisinopril 40 mg once daily (need to switch to [facility] upon discharge).  Not a candidate for invasive ischemia evaluation due to CKD stage IV not on hemodialysis. Never had any angina. Will continue medical management for now There was documentation of CAD, will obtain medical records from the [facility].  For paroxysmal A-fib, continue carvedilol 12.5 mg twice daily and Eliquis 2.5 mg twice daily (adjusted from 5 mg to 2.5 on admission due to age and renal function).  For OSA not on CPAP, intolerant to CPAP and currently uses home oxygen at night.   Codi Kertz Verne Spurr, MD   [facility]  10:55 AM

## 2022-12-25 NOTE — Plan of Care (Signed)

## 2022-12-25 NOTE — Progress Notes (Signed)
PROGRESS NOTE   [name]  EXB:[id number] DOB: 05-Apr-1941 DOA: 12/23/2022 PCP: Assunta Found, MD   Chief Complaint  Patient presents with   Shortness of Breath   Level of care: Telemetry  Brief Admission History:  82 y.o. male with medical history significant of COPD on supplemental oxygen via [place] at 2 LPM as needed, CHF, hypertension, hyperlipidemia, GERD, BPH, atrial fibrillation on Eliquis who presents to the emergency department due to worsening shortness of breath that has been ongoing for about 10 days and require consistent use of oxygen at all times (change from baseline).  Shortness of breath was associated with white sputum with occasional streaks of blood and no frank hemoptysis.  He complained of 3-week onset of leg swelling and recent gain of 5 pounds.  Patient denies chest pain, fever, chills.  [facility] and was asked to go to the nearest ED for further evaluation.  He was admitted with acute HFrEF.     Assessment and Plan:  Acute HFrEF - he remains volume overloaded - continue IV furosemide (dose increased by cardiology)  - continue carvedilol and lisinopril  - monitoring I/Os, weights closely  - recheck BMP in AM   Acute on chronic respiratory failure with hypoxia  - secondary to acute HFrEF - improving with diuresis - continue to wean oxygen as able to baseline  pAF - stable  - he is fully anticoagulated on apixaban  - his HR is controlled by carvedilol (dose increased by cardio on 7/17)  COPD - no s/s of acute exacerbation  - continue bronchodilators  - supplemental oxygen as needed  BPH - stable on home tamsulosin - added PRN order for bladder scan and I/O cath   Physical deconditioning - obtain PT eval   DVT prophylaxis: apixaban  Code Status: Full  Family Communication: -  Disposition: Status is: Inpatient Remains inpatient appropriate because: IV furosemide    Consultants:   cardiology Procedures:   Antimicrobials:    Subjective: Pt reports he has been urinating on IV furosemide  Objective: Vitals:   12/25/22 0833 12/25/22 1035 12/25/22 1127 12/25/22 1350  BP: 100/64 (!) 105/56 98/78   Pulse:  83    Resp: 19 18    Temp:  97.9 F (36.6 C)    TempSrc:  Oral    SpO2: 94% 94%  95%  Weight:      Height:        Intake/Output Summary (Last 24 hours) at 12/25/2022 1646 Last data filed at 12/25/2022 0900 Gross per 24 hour  Intake 360 ml  Output 1100 ml  Net -740 ml   Filed Weights   12/23/22 1444 12/24/22 0100 12/25/22 0500  Weight: 98.4 kg 102.1 kg 100.1 kg   Examination:  General exam: Appears calm and comfortable  Respiratory system: bibasilar crackles. Cardiovascular system: normal S1 & S2 heard. No JVD, murmurs, rubs, gallops or clicks. No pedal edema. Gastrointestinal system: 1+ edema. Abdomen is nondistended, soft and nontender. No organomegaly or masses felt. Normal bowel sounds heard. Central nervous system: Alert and oriented. No focal neurological deficits. Extremities: 2+ edema. Symmetric 5 x 5 power. Skin: No rashes, lesions or ulcers. Psychiatry: Judgement and insight appear normal. Mood & affect appropriate.   Data Reviewed: I have personally reviewed following labs and imaging studies  CBC: Recent Labs  Lab 12/23/22 1552 12/24/22 0556  WBC 10.9* 11.3*  HGB 12.0* 11.6*  HCT 39.3 37.9*  MCV  95.2 94.3  PLT 243 228    Basic Metabolic Panel: Recent Labs  Lab 12/23/22 1552 12/24/22 0556 12/25/22 0421  NA 134* 135 135  K 4.4 4.2 4.3  CL 102 103 102  CO2 23 22 24   GLUCOSE 89 91 113*  BUN 40* 41* 43*  CREATININE 3.21* 3.23* 3.19*  CALCIUM 8.6* 8.9 9.1  MG  --  2.0  --   PHOS  --  3.7 3.9    CBG: No results for input(s): "GLUCAP" in the last 168 hours.  No results found for this or any previous visit (from the past 240 hour(s)).   Radiology Studies: ECHOCARDIOGRAM COMPLETE  Result Date: 12/24/2022     ECHOCARDIOGRAM REPORT   Patient Name:   [name] Date of Exam: 12/24/2022 Medical Rec #:  [id number]   Height:       67.0 in Accession #:    [id number]  Weight:       225.1 lb Date of Birth:  [birth year]   BSA:          2.126 m Patient Age:    81 years    BP:           121/79 mmHg Patient Gender: M           HR:           71 bpm. [facility] Procedure: 2D Echo, Intracardiac Opacification Agent, Cardiac Doppler and Color            Doppler Indications:    CHF-Acute Diastolic I50.31  History:        Patient has no prior history of Echocardiogram examinations.                 CHF, COPD, Arrythmias:Atrial Fibrillation; Risk                 Factors:Hypertension, Dyslipidemia, Former Smoker and Sleep                 Apnea.  Sonographer:    Aron Baba Referring Phys: [id number] OLADAPO ADEFESO  Sonographer Comments: Patient is obese and Technically difficult study due to poor echo windows. IMPRESSIONS  1. Left ventricular ejection fraction, by estimation, is 25 to 30%. The left ventricle has moderate to severely decreased function. The left ventricle demonstrates global hypokinesis. Left ventricular diastolic parameters are consistent with Grade I diastolic dysfunction (impaired relaxation).  2. Right ventricular systolic function is severely reduced. The right ventricular size is moderately enlarged. There is moderately elevated pulmonary artery systolic pressure. The estimated right ventricular systolic pressure is 51.7 mmHg.  3. Right atrial size was severely dilated.  4. The mitral valve is normal in structure. No evidence of mitral valve regurgitation. No evidence of mitral stenosis.  5. The aortic valve is tricuspid. Aortic valve regurgitation is not visualized. No aortic stenosis is present.  6. The inferior vena cava is normal in size with greater than 50% respiratory variability, suggesting right atrial pressure of 3 mmHg. Comparison(s): No prior Echocardiogram. FINDINGS  Left Ventricle: Left  ventricular ejection fraction, by estimation, is 25 to 30%. The left ventricle has moderate to severely decreased function. The left ventricle demonstrates global hypokinesis. Definity contrast agent was given IV to delineate the left ventricular endocardial borders. The left ventricular internal cavity size was normal in size. There is no left ventricular hypertrophy. Left ventricular diastolic parameters are consistent with Grade I diastolic dysfunction (impaired relaxation). Right Ventricle: The right ventricular size is  moderately enlarged. No increase in right ventricular wall thickness. Right ventricular systolic function is severely reduced. There is moderately elevated pulmonary artery systolic pressure. The tricuspid regurgitant velocity is 3.49 m/s, and with an assumed right atrial pressure of 3 mmHg, the estimated right ventricular systolic pressure is 51.7 mmHg. Left Atrium: Left atrial size was normal in size. Right Atrium: Right atrial size was severely dilated. Pericardium: There is no evidence of pericardial effusion. Mitral Valve: The mitral valve is normal in structure. No evidence of mitral valve regurgitation. No evidence of mitral valve stenosis. Tricuspid Valve: The tricuspid valve is not well visualized. Tricuspid valve regurgitation is trivial. No evidence of tricuspid stenosis. Aortic Valve: The aortic valve is tricuspid. Aortic valve regurgitation is not visualized. No aortic stenosis is present. Pulmonic Valve: The pulmonic valve was not well visualized. Pulmonic valve regurgitation is trivial. No evidence of pulmonic stenosis. Aorta: The aortic root and ascending aorta are structurally normal, with no evidence of dilitation. Venous: The inferior vena cava is normal in size with greater than 50% respiratory variability, suggesting right atrial pressure of 3 mmHg. IAS/Shunts: No atrial level shunt detected by color flow Doppler.  LEFT VENTRICLE PLAX 2D LVIDd:         3.80 cm   Diastology  LVIDs:         3.40 cm   LV e' medial:    2.78 cm/s LV PW:         1.00 cm   LV E/e' medial:  16.6 LV IVS:        0.70 cm   LV e' lateral:   4.58 cm/s LVOT diam:     2.40 cm   LV E/e' lateral: 10.1 LV SV:         50 LV SV Index:   24 LVOT Area:     4.52 cm  RIGHT VENTRICLE RV S prime:     9.48 cm/s TAPSE (M-mode): 2.5 cm LEFT ATRIUM             Index        RIGHT ATRIUM           Index LA diam:        3.80 cm 1.79 cm/m   RA Area:     32.10 cm LA Vol (A2C):   72.2 ml 33.95 ml/m  RA Volume:   123.00 ml 57.84 ml/m LA Vol (A4C):   57.6 ml 27.09 ml/m LA Biplane Vol: 65.4 ml 30.76 ml/m  AORTIC VALVE             PULMONIC VALVE LVOT Vmax:   67.00 cm/s  PR End Diast Vel: 4.67 msec LVOT Vmean:  42.350 cm/s LVOT VTI:    0.111 m  AORTA Ao Root diam: 3.60 cm Ao Asc diam:  3.50 cm MITRAL VALVE               TRICUSPID VALVE MV Area (PHT): 3.46 cm    TR Peak grad:   48.7 mmHg MV Decel Time: 219 msec    TR Vmax:        349.00 cm/s MR Peak grad: 3.8 mmHg MR Vmax:      97.00 cm/s   SHUNTS MV E velocity: 46.10 cm/s  Systemic VTI:  0.11 m MV A velocity: 81.00 cm/s  Systemic Diam: 2.40 cm MV E/A ratio:  0.57 Vishnu Priya Mallipeddi Electronically signed by Winfield Rast Mallipeddi Signature Date/Time: 12/24/2022/12:12:13 PM    Final    CT Chest Wo Contrast  Result Date: 12/23/2022 CLINICAL DATA:  Pneumonia with complication suspected. X-ray done. Shortness of breath for 2 weeks. EXAM: CT CHEST WITHOUT CONTRAST TECHNIQUE: Multidetector CT imaging of the chest was performed following the standard protocol without IV contrast. RADIATION DOSE REDUCTION: This exam was performed according to the departmental dose-optimization program which includes automated exposure control, adjustment of the mA and/or kV according to patient size and/or use of iterative reconstruction technique. COMPARISON:  Chest radiograph 12/23/2022 FINDINGS: Cardiovascular: Normal heart size. No pericardial effusions. Normal caliber thoracic aorta.  Calcification of the aorta and coronary arteries. Mediastinum/Nodes: Thyroid gland is unremarkable. Esophagus is decompressed. Prominent mediastinal lymph nodes. Largest pretracheal lymph nodes measure 1.9 cm in short axis dimension. Lungs/Pleura: Emphysematous changes in the lungs. Scarring in the lung bases. This likely accounts for the opacity seen at prior chest radiograph. No consolidation or airspace disease. No pleural effusions. No pneumothorax. Upper Abdomen: Cholelithiasis. 5.6 cm diameter cyst in the upper pole left kidney. No imaging follow-up is indicated. Musculoskeletal: Degenerative changes in the spine. No destructive bone lesions. IMPRESSION: 1. Emphysematous changes and fibrosis in the lungs likely account for chest radiographic findings. No evidence of pneumonia or consolidation. 2. Moderate mediastinal lymphadenopathy of indeterminate etiology. Changes may be reactive or could indicate lymphoproliferative disorder. Clinical follow-up is suggested. 3. Aortic atherosclerosis. 4. Cholelithiasis. Electronically Signed   By: Burman Nieves M.D.   On: 12/23/2022 18:31    Scheduled Meds:  albuterol  2.5 mg Nebulization TID   apixaban  2.5 mg Oral BID   carvedilol  12.5 mg Oral BID WC   furosemide  60 mg Intravenous Q12H   lisinopril  40 mg Oral Daily   melatonin  3 mg Oral QHS   mometasone-formoterol  2 puff Inhalation BID   pantoprazole  40 mg Oral Daily   tamsulosin  0.4 mg Oral QPC supper   umeclidinium bromide  1 puff Inhalation Daily   Continuous Infusions:   LOS: 2 days   Time spent: 41 mins  Eliza Green Laural Benes, MD How to contact the [facility] Attending or Consulting provider 7A - 7P or covering provider during after hours 7P -7A, for this patient?  Check the care team in [facility] and look for a) attending/consulting TRH provider listed and b) the [facility] team listed Log into [email or web address] and use [place]'s universal password to access. If you do not have the password, please contact  the hospital operator. Locate the [facility] provider you are looking for under [facility] and page to a number that you can be directly reached. If you still have difficulty reaching the provider, please page the [facility] (Director on Call) for the Hospitalists listed on amion for assistance.  12/25/2022, 4:46 PM

## 2022-12-25 NOTE — Progress Notes (Signed)
Patient slept on and off this shift. Chloraseptic given per standing order for throat irritation. No complaint of any other pain. Continued to monitor.

## 2022-12-25 NOTE — Progress Notes (Signed)
Attempted to remove O2 this morning from patient due to irritation. Oxygen saturation dropped from 94% to 88%.   Pt placed back on 2L of oxygen. Repositioned in bed, call light within reach. No complaints at this time.

## 2022-12-25 NOTE — Progress Notes (Signed)
Mobility Specialist Progress Note:    12/25/22 1202  Mobility  Activity Ambulated with assistance in hallway  Level of Assistance Minimal assist, patient does 75% or more  Assistive Device Front wheel walker  Distance Ambulated (ft) 60 ft  Range of Motion/Exercises Active;All extremities  Activity Response Tolerated well  Mobility Referral Yes  $Mobility charge 1 Mobility  Mobility Specialist Start Time (ACUTE ONLY) 1145  Mobility Specialist Stop Time (ACUTE ONLY) 1155  Mobility Specialist Time Calculation (min) (ACUTE ONLY) 10 min   Pt received at EOB, agreeable to mobility session. Ambulated in hallway with RW, MinA to stand, SBA for remainder of session.Tolerated well, slight SOB throughout, SpO2 90% on 1L while ambulating. Pt denies dizziness, pain or fatigue. Returned pt to room, sitting EOB, pt recovered, SpO2 96% on 1L. Left pt with all needs met, call bell in reach.   Feliciana Rossetti Mobility Specialist Please contact via Special educational needs teacher or  Rehab office at [phone]

## 2022-12-25 NOTE — Plan of Care (Signed)
  Problem: Education: Goal: Knowledge of General Education information will improve Description Including pain rating scale, medication(s)/side effects and non-pharmacologic comfort measures Outcome: Progressing   

## 2022-12-25 NOTE — Hospital Course (Signed)
82 y.o. male with medical history significant of COPD on supplemental oxygen via [place] at 2 LPM as needed, CHF, hypertension, hyperlipidemia, GERD, BPH, atrial fibrillation on Eliquis who presents to the emergency department due to worsening shortness of breath that has been ongoing for about 10 days and require consistent use of oxygen at all times (change from baseline).  Shortness of breath was associated with white sputum with occasional streaks of blood and no frank hemoptysis.  He complained of 3-week onset of leg swelling and recent gain of 5 pounds.  Patient denies chest pain, fever, chills.  [facility] and was asked to go to the nearest ED for further evaluation.  He was admitted with acute HFrEF.

## 2022-12-26 ENCOUNTER — Inpatient Hospital Stay (HOSPITAL_COMMUNITY): Payer: No Typology Code available for payment source

## 2022-12-26 DIAGNOSIS — N400 Enlarged prostate without lower urinary tract symptoms: Secondary | ICD-10-CM | POA: Diagnosis not present

## 2022-12-26 DIAGNOSIS — N184 Chronic kidney disease, stage 4 (severe): Secondary | ICD-10-CM

## 2022-12-26 DIAGNOSIS — I5021 Acute systolic (congestive) heart failure: Secondary | ICD-10-CM | POA: Diagnosis not present

## 2022-12-26 DIAGNOSIS — E782 Mixed hyperlipidemia: Secondary | ICD-10-CM | POA: Diagnosis not present

## 2022-12-26 DIAGNOSIS — I10 Essential (primary) hypertension: Secondary | ICD-10-CM | POA: Diagnosis not present

## 2022-12-26 DIAGNOSIS — I50810 Right heart failure, unspecified: Secondary | ICD-10-CM

## 2022-12-26 LAB — CBC
HCT: 36 % — ABNORMAL LOW (ref 39.0–52.0)
Hemoglobin: 11.4 g/dL — ABNORMAL LOW (ref 13.0–17.0)
MCH: 29.2 pg (ref 26.0–34.0)
MCHC: 31.7 g/dL (ref 30.0–36.0)
MCV: 92.1 fL (ref 80.0–100.0)
Platelets: 266 10*3/uL (ref 150–400)
RBC: 3.91 MIL/uL — ABNORMAL LOW (ref 4.22–5.81)
RDW: 16.2 % — ABNORMAL HIGH (ref 11.5–15.5)
WBC: 14.5 10*3/uL — ABNORMAL HIGH (ref 4.0–10.5)
nRBC: 0 % (ref 0.0–0.2)

## 2022-12-26 LAB — BASIC METABOLIC PANEL
Anion gap: 11 (ref 5–15)
BUN: 45 mg/dL — ABNORMAL HIGH (ref 8–23)
CO2: 22 mmol/L (ref 22–32)
Calcium: 8.7 mg/dL — ABNORMAL LOW (ref 8.9–10.3)
Chloride: 100 mmol/L (ref 98–111)
Creatinine, Ser: 3.16 mg/dL — ABNORMAL HIGH (ref 0.61–1.24)
GFR, Estimated: 19 mL/min — ABNORMAL LOW (ref >60)
Glucose, Bld: 121 mg/dL — ABNORMAL HIGH (ref 70–99)
Potassium: 3.9 mmol/L (ref 3.5–5.1)
Sodium: 133 mmol/L — ABNORMAL LOW (ref 135–145)

## 2022-12-26 LAB — MAGNESIUM: Magnesium: 2 mg/dL (ref 1.7–2.4)

## 2022-12-26 MED ORDER — FUROSEMIDE 10 MG/ML IJ SOLN
80.0000 mg | Freq: Two times a day (BID) | INTRAMUSCULAR | Status: DC
  Administered 2022-12-26: 80 mg via INTRAVENOUS
  Filled 2022-12-26 (×2): qty 8

## 2022-12-26 MED ORDER — METOPROLOL SUCCINATE ER 25 MG PO TB24
25.0000 mg | ORAL_TABLET | Freq: Every day | ORAL | Status: DC
  Administered 2022-12-27: 25 mg via ORAL
  Filled 2022-12-26: qty 1

## 2022-12-26 MED ORDER — TORSEMIDE 20 MG PO TABS: 40.0000 mg | ORAL_TABLET | Freq: Two times a day (BID) | ORAL | Status: DC

## 2022-12-26 NOTE — Progress Notes (Signed)
PROGRESS NOTE   [name]  YQM:[id number] DOB: 1940-07-02 DOA: 12/23/2022 PCP: Assunta Found, MD   Chief Complaint  Patient presents with   Shortness of Breath   Level of care: Telemetry  Brief Admission History:  82 y.o. male with medical history significant of COPD on supplemental oxygen via [place] at 2 LPM as needed, CHF, hypertension, hyperlipidemia, GERD, BPH, atrial fibrillation on Eliquis who presents to the emergency department due to worsening shortness of breath that has been ongoing for about 10 days and require consistent use of oxygen at all times (change from baseline).  Shortness of breath was associated with white sputum with occasional streaks of blood and no frank hemoptysis.  He complained of 3-week onset of leg swelling and recent gain of 5 pounds.  Patient denies chest pain, fever, chills.  [facility] and was asked to go to the nearest ED for further evaluation.  He was admitted with acute HFrEF.     Assessment and Plan:  Acute HFrEF - he remains volume overloaded - continue IV furosemide (dose increased by cardiology)  - continue carvedilol and lisinopril  - monitoring I/Os, weights closely  - reDS protocol reading: 40  - recheck BMP in AM  Filed Weights   12/24/22 0100 12/25/22 0500 12/26/22 0500  Weight: 102.1 kg 100.1 kg 99.3 kg    Intake/Output Summary (Last 24 hours) at 12/26/2022 1356 Last data filed at 12/26/2022 0900 Gross per 24 hour  Intake 1080 ml  Output 2200 ml  Net -1120 ml   Acute on chronic respiratory failure with hypoxia  - secondary to acute HFrEF - improving with diuresis - continue to wean oxygen as able to baseline  pAF - stable  - he is fully anticoagulated on apixaban  - his HR is controlled by carvedilol (dose increased by cardio on 7/17)  COPD - no s/s of acute exacerbation  - continue bronchodilators  - supplemental oxygen as needed  BPH - stable on home  tamsulosin - added PRN order for bladder scan and I/O cath   Physical deconditioning - PT eval  - ambulate  DVT prophylaxis: apixaban  Code Status: Full  Family Communication: -  Disposition: Status is: Inpatient Remains inpatient appropriate because: IV furosemide    Consultants:  cardiology Procedures:   Antimicrobials:    Subjective: Pt is still very SOB today.    Objective: Vitals:   12/26/22 0800 12/26/22 0801 12/26/22 0830 12/26/22 1251  BP:   95/62 132/75  Pulse:    86  Resp:    18  Temp:    97.8 F (36.6 C)  TempSrc:    Oral  SpO2: 96% 98%  97%  Weight:      Height:        Intake/Output Summary (Last 24 hours) at 12/26/2022 1356 Last data filed at 12/26/2022 0900 Gross per 24 hour  Intake 1080 ml  Output 2200 ml  Net -1120 ml   Filed Weights   12/24/22 0100 12/25/22 0500 12/26/22 0500  Weight: 102.1 kg 100.1 kg 99.3 kg   Examination:  General exam: Appears calm and comfortable  Respiratory system: rare bibasilar crackles. Cardiovascular system: normal S1 & S2 heard. No JVD, murmurs, rubs, gallops or clicks. 1+ pedal edema. Gastrointestinal system: 1+ edema. Abdomen is nondistended, soft and nontender. No organomegaly or masses felt. Normal bowel sounds heard. Central nervous system: Alert and oriented. No focal neurological deficits. Extremities: 1+ edema.  Symmetric 5 x 5 power. Skin: No rashes, lesions or ulcers. Psychiatry: Judgement and insight appear normal. Mood & affect appropriate.   Data Reviewed: I have personally reviewed following labs and imaging studies  CBC: Recent Labs  Lab 12/23/22 1552 12/24/22 0556 12/26/22 0405  WBC 10.9* 11.3* 14.5*  HGB 12.0* 11.6* 11.4*  HCT 39.3 37.9* 36.0*  MCV 95.2 94.3 92.1  PLT 243 228 266    Basic Metabolic Panel: Recent Labs  Lab 12/23/22 1552 12/24/22 0556 12/25/22 0421 12/26/22 0405  NA 134* 135 135 133*  K 4.4 4.2 4.3 3.9  CL 102 103 102 100  CO2 23 22 24 22   GLUCOSE 89 91 113*  121*  BUN 40* 41* 43* 45*  CREATININE 3.21* 3.23* 3.19* 3.16*  CALCIUM 8.6* 8.9 9.1 8.7*  MG  --  2.0  --  2.0  PHOS  --  3.7 3.9  --     CBG: No results for input(s): "GLUCAP" in the last 168 hours.  No results found for this or any previous visit (from the past 240 hour(s)).   Radiology Studies: DG CHEST PORT 1 VIEW  Result Date: 12/26/2022 CLINICAL DATA:  Cough, pulmonary fibrosis EXAM: PORTABLE CHEST 1 VIEW COMPARISON:  Previous studies including CT done on 12/23/2022 FINDINGS: Transverse diameter of heart is increased. Low lung volumes. There are no signs of pulmonary edema or focal pulmonary consolidation. There is no significant pleural effusion or pneumothorax. IMPRESSION: Cardiomegaly. There are no signs of pulmonary edema or new focal pulmonary consolidation. Electronically Signed   By: Ernie Avena M.D.   On: 12/26/2022 08:46    Scheduled Meds:  albuterol  2.5 mg Nebulization TID   apixaban  2.5 mg Oral BID   diclofenac Sodium  2 g Topical QID   furosemide  80 mg Intravenous BID   melatonin  3 mg Oral QHS   [START ON 12/27/2022] metoprolol succinate  25 mg Oral Daily   mometasone-formoterol  2 puff Inhalation BID   pantoprazole  40 mg Oral Daily   tamsulosin  0.4 mg Oral QPC supper   traZODone  25 mg Oral QHS   umeclidinium bromide  1 puff Inhalation Daily   Continuous Infusions:   LOS: 3 days   Time spent: 37 mins  Donnivan Villena Laural Benes, MD How to contact the [facility] Attending or Consulting provider 7A - 7P or covering provider during after hours 7P -7A, for this patient?  Check the care team in [facility] and look for a) attending/consulting TRH provider listed and b) the [facility] team listed Log into [email or web address] and use [place]'s universal password to access. If you do not have the password, please contact the hospital operator. Locate the [facility] provider you are looking for under [facility] and page to a number that you can be directly reached. If you still have  difficulty reaching the provider, please page the [facility] (Director on Call) for the Hospitalists listed on amion for assistance.  12/26/2022, 1:56 PM

## 2022-12-26 NOTE — Plan of Care (Signed)
  Problem: Acute Rehab PT Goals(only PT should resolve) Goal: Pt Will Go Supine/Side To Sit Outcome: Progressing Flowsheets (Taken 12/26/2022 1151) Pt will go Supine/Side to Sit:  Independently  with modified independence Goal: Patient Will Transfer Sit To/From Stand Outcome: Progressing Flowsheets (Taken 12/26/2022 1151) Patient will transfer sit to/from stand:  Independently  with modified independence Goal: Pt Will Transfer Bed To Chair/Chair To Bed Outcome: Progressing Flowsheets (Taken 12/26/2022 1151) Pt will Transfer Bed to Chair/Chair to Bed: with modified independence Goal: Pt Will Ambulate Outcome: Progressing Flowsheets (Taken 12/26/2022 1151) Pt will Ambulate:  100 feet  with modified independence  with rolling walker  with least restrictive assistive device   11:52 AM, 12/26/22 Ocie Bob, MPT Physical Therapist with [facility] [phone] office [phone] mobile phone

## 2022-12-26 NOTE — Evaluation (Signed)
Physical Therapy Evaluation Patient Details Name: [name] MRN: [id number] DOB: [birth year] Today's Date: 12/26/2022  History of Present Illness  [name] is a 82 y.o. male with medical history significant of COPD on supplemental oxygen via [place] at 2 LPM as needed, CHF, hypertension, hyperlipidemia, GERD, BPH, atrial fibrillation on Eliquis who presents to the emergency department due to worsening shortness of breath that has been ongoing for about 10 days and require consistent use of oxygen at all times (change from baseline).  Shortness of breath was associated with white sputum with occasional streaks of blood and no frank hemoptysis.  He complained of 3-week onset of leg swelling and recent gain of 5 pounds.  Patient denies chest pain, fever, chills.  [facility] and was asked to go to the nearest ED for further evaluation.   Clinical Impression  Patient required increased time with labored movement for standing up from bedside due to bilateral knee pain, left worse than right, able to walk to walkway without AD, but required use of RW for longer distances due to knee pain and demonstrated good return for walking in hallway without loss of balance.  Patient on 2 LPM O2 with SpO2 at 92% during ambulation and tolerated sitting up in chair after therapy.  Patient will benefit from continued skilled physical therapy in hospital and recommended venue below to increase strength, balance, endurance for safe ADLs and gait.         Assistance Recommended at Discharge Set up Supervision/Assistance  If plan is discharge home, recommend the following:  Can travel by private vehicle  A little help with walking and/or transfers;A little help with bathing/dressing/bathroom;Help with stairs or ramp for entrance;Assistance with cooking/housework        Equipment Recommendations None recommended by PT  Recommendations for Other Services        Functional Status Assessment Patient has had a recent decline in their functional status and demonstrates the ability to make significant improvements in function in a reasonable and predictable amount of time.     Precautions / Restrictions Precautions Precautions: Fall Restrictions Weight Bearing Restrictions: No      Mobility  Bed Mobility Overal bed mobility: Modified Independent                  Transfers Overall transfer level: Needs assistance Equipment used: None, Rolling walker (2 wheels) Transfers: Sit to/from Stand, Bed to chair/wheelchair/BSC Sit to Stand: Supervision   Step pivot transfers: Supervision       General transfer comment: required increased time with labored movement for completing sit to stands due to bilateral knee pain    Ambulation/Gait Ambulation/Gait assistance: Supervision Gait Distance (Feet): 80 Feet Assistive device: Rolling walker (2 wheels) Gait Pattern/deviations: Decreased step length - right, Decreased step length - left, Decreased stride length, Antalgic, Decreased stance time - left Gait velocity: decreased     General Gait Details: able to walk to doorway without AD, but c/o increasing left knee pain and required use of RW for rest of gait training with good return for using RW without loss of balance  Stairs            Wheelchair Mobility     Tilt Bed    Modified Rankin (Stroke Patients Only)       Balance Overall balance assessment: Needs assistance Sitting-balance support: Feet supported, No upper extremity supported Sitting balance-Leahy Scale: Good Sitting balance -  Comments: seated at EOB   Standing balance support: During functional activity, No upper extremity supported Standing balance-Leahy Scale: Poor Standing balance comment: fair/poor without AD, fair/good using RW                             Pertinent Vitals/Pain Pain Assessment Pain Assessment: Faces Faces Pain Scale:  Hurts little more Pain Location: knees, mostly left Pain Descriptors / Indicators: Sore, Grimacing, Discomfort Pain Intervention(s): Limited activity within patient's tolerance, Monitored during session, Repositioned    Home Living Family/patient expects to be discharged to:: Private residence Living Arrangements: Spouse/significant other Available Help at Discharge: Family;Available 24 hours/day Type of Home: Apartment Home Access: Level entry       Home Layout: One level Home Equipment: Agricultural consultant (2 wheels);Cane - single point;Grab bars - tub/shower;BSC/3in1      Prior Function Prior Level of Function : Independent/Modified Independent             Mobility Comments: Community ambulator using SPC PRN, drives, shops ADLs Comments: Independent     Hand Dominance        Extremity/Trunk Assessment   Upper Extremity Assessment Upper Extremity Assessment: Overall WFL for tasks assessed    Lower Extremity Assessment Lower Extremity Assessment: Generalized weakness    Cervical / Trunk Assessment Cervical / Trunk Assessment: Normal  Communication   Communication: No difficulties  Cognition Arousal/Alertness: Awake/alert Behavior During Therapy: WFL for tasks assessed/performed Overall Cognitive Status: Within Functional Limits for tasks assessed                                          General Comments      Exercises     Assessment/Plan    PT Assessment Patient needs continued PT services  PT Problem List Decreased strength;Decreased activity tolerance;Decreased balance;Decreased mobility       PT Treatment Interventions DME instruction;Gait training;Stair training;Functional mobility training;Therapeutic activities;Therapeutic exercise;Patient/family education;Balance training    PT Goals (Current goals can be found in the Care Plan section)  Acute Rehab PT Goals Patient Stated Goal: return home PT Goal Formulation: With  patient Time For Goal Achievement: 12/28/22 Potential to Achieve Goals: Good    Frequency Min 3X/week     Co-evaluation               AM-PAC PT "6 Clicks" Mobility  Outcome Measure Help needed turning from your back to your side while in a flat bed without using bedrails?: None Help needed moving from lying on your back to sitting on the side of a flat bed without using bedrails?: None Help needed moving to and from a bed to a chair (including a wheelchair)?: None Help needed standing up from a chair using your arms (e.g., wheelchair or bedside chair)?: A Little Help needed to walk in hospital room?: A Little Help needed climbing 3-5 steps with a railing? : A Little 6 Click Score: 21    End of Session Equipment Utilized During Treatment: Oxygen Activity Tolerance: Patient tolerated treatment well;Patient limited by fatigue Patient left: in chair;with call bell/phone within reach Nurse Communication: Mobility status PT Visit Diagnosis: Other abnormalities of gait and mobility (R26.89);Unsteadiness on feet (R26.81);Muscle weakness (generalized) (M62.81)    Time: 6962-9528 PT Time Calculation (min) (ACUTE ONLY): 25 min   Charges:   PT Evaluation $PT Eval Moderate Complexity: 1 Mod  PT Treatments $Therapeutic Activity: 23-37 mins PT General Charges $$ ACUTE PT VISIT: 1 Visit         11:50 AM, 12/26/22 Ocie Bob, MPT Physical Therapist with [facility] [phone] office [phone] mobile phone

## 2022-12-26 NOTE — Progress Notes (Signed)
Pt walked 100+ feet with front wheel walker with standby assistance and 2L of o2.  No c/o SOB or dizziness.

## 2022-12-26 NOTE — Progress Notes (Addendum)
Progress Note  Patient Name: [name] Date of Encounter: 12/26/2022  Primary Cardiologist: None  Subjective   No acute events overnight. Has some SOB but significantly improved compared to admission day. LE swelling improved.  Inpatient Medications    Scheduled Meds:  albuterol  2.5 mg Nebulization TID   apixaban  2.5 mg Oral BID   carvedilol  12.5 mg Oral BID WC   diclofenac Sodium  2 g Topical QID   furosemide  60 mg Intravenous Q12H   melatonin  3 mg Oral QHS   mometasone-formoterol  2 puff Inhalation BID   pantoprazole  40 mg Oral Daily   tamsulosin  0.4 mg Oral QPC supper   traZODone  25 mg Oral QHS   umeclidinium bromide  1 puff Inhalation Daily   Continuous Infusions:  PRN Meds: acetaminophen **OR** acetaminophen, albuterol, ondansetron **OR** ondansetron (ZOFRAN) IV, phenol   Vital Signs    Vitals:   12/26/22 0500 12/26/22 0800 12/26/22 0801 12/26/22 0830  BP:    95/62  Pulse:      Resp:      Temp:      TempSrc:      SpO2:  96% 98%   Weight: 99.3 kg     Height:        Intake/Output Summary (Last 24 hours) at 12/26/2022 0948 Last data filed at 12/26/2022 0500 Gross per 24 hour  Intake 840 ml  Output 2200 ml  Net -1360 ml   Filed Weights   12/24/22 0100 12/25/22 0500 12/26/22 0500  Weight: 102.1 kg 100.1 kg 99.3 kg    Telemetry     Personally reviewed, NSR.  ECG    Not performed today  Physical Exam   GEN: No acute distress.   Neck: JVD not examined due to body habitus Cardiac: RRR, no murmur, rub, or gallop.  Respiratory: Nonlabored. Clear to auscultation bilaterally. GI: Soft, nontender, bowel sounds present. MS: No edema; No deformity. Neuro:  Nonfocal. Psych: Alert and oriented x 3. Normal affect.  Labs    Chemistry Recent Labs  Lab 12/24/22 0556 12/25/22 0421 12/26/22 0405  NA 135 135 133*  K 4.2 4.3 3.9  CL 103 102 100  CO2 22 24 22   GLUCOSE 91 113* 121*  BUN 41* 43* 45*  CREATININE 3.23* 3.19* 3.16*  CALCIUM  8.9 9.1 8.7*  PROT 7.1  --   --   ALBUMIN 3.2* 3.3*  --   AST 22  --   --   ALT 18  --   --   ALKPHOS 57  --   --   BILITOT 0.9  --   --   GFRNONAA 19* 19* 19*  ANIONGAP 10 9 11      Hematology Recent Labs  Lab 12/23/22 1552 12/24/22 0556 12/26/22 0405  WBC 10.9* 11.3* 14.5*  RBC 4.13* 4.02* 3.91*  HGB 12.0* 11.6* 11.4*  HCT 39.3 37.9* 36.0*  MCV 95.2 94.3 92.1  MCH 29.1 28.9 29.2  MCHC 30.5 30.6 31.7  RDW 16.4* 16.2* 16.2*  PLT 243 228 266    Cardiac Enzymes Recent Labs  Lab 12/23/22 1735 12/23/22 2011 12/23/22 2222 12/24/22 0002  TROPONINIHS 17 19* 19* 20*    BNP Recent Labs  Lab 12/23/22 1735  BNP 293.0*     DDimerNo results for input(s): "DDIMER" in the last 168 hours.   Radiology    DG CHEST PORT 1 VIEW  Result Date: 12/26/2022 CLINICAL DATA:  Cough, pulmonary fibrosis EXAM: PORTABLE  CHEST 1 VIEW COMPARISON:  Previous studies including CT done on 12/23/2022 FINDINGS: Transverse diameter of heart is increased. Low lung volumes. There are no signs of pulmonary edema or focal pulmonary consolidation. There is no significant pleural effusion or pneumothorax. IMPRESSION: Cardiomegaly. There are no signs of pulmonary edema or new focal pulmonary consolidation. Electronically Signed   By: Ernie Avena M.D.   On: 12/26/2022 08:46   ECHOCARDIOGRAM COMPLETE  Result Date: 12/24/2022    ECHOCARDIOGRAM REPORT   Patient Name:   TEVYN CODD Date of Exam: 12/24/2022 Medical Rec #:  [id number]   Height:       67.0 in Accession #:    [id number]  Weight:       225.1 lb Date of Birth:  Aug 03, 1940   BSA:          2.126 m Patient Age:    82 years    BP:           121/79 mmHg Patient Gender: M           HR:           71 bpm. [facility] Procedure: 2D Echo, Intracardiac Opacification Agent, Cardiac Doppler and Color            Doppler Indications:    CHF-Acute Diastolic I50.31  History:        Patient has no prior history of Echocardiogram examinations.                  CHF, COPD, Arrythmias:Atrial Fibrillation; Risk                 Factors:Hypertension, Dyslipidemia, Former Smoker and Sleep                 Apnea.  Sonographer:    Aron Baba Referring Phys: [id number] OLADAPO ADEFESO  Sonographer Comments: Patient is obese and Technically difficult study due to poor echo windows. IMPRESSIONS  1. Left ventricular ejection fraction, by estimation, is 25 to 30%. The left ventricle has moderate to severely decreased function. The left ventricle demonstrates global hypokinesis. Left ventricular diastolic parameters are consistent with Grade I diastolic dysfunction (impaired relaxation).  2. Right ventricular systolic function is severely reduced. The right ventricular size is moderately enlarged. There is moderately elevated pulmonary artery systolic pressure. The estimated right ventricular systolic pressure is 51.7 mmHg.  3. Right atrial size was severely dilated.  4. The mitral valve is normal in structure. No evidence of mitral valve regurgitation. No evidence of mitral stenosis.  5. The aortic valve is tricuspid. Aortic valve regurgitation is not visualized. No aortic stenosis is present.  6. The inferior vena cava is normal in size with greater than 50% respiratory variability, suggesting right atrial pressure of 3 mmHg. Comparison(s): No prior Echocardiogram. FINDINGS  Left Ventricle: Left ventricular ejection fraction, by estimation, is 25 to 30%. The left ventricle has moderate to severely decreased function. The left ventricle demonstrates global hypokinesis. Definity contrast agent was given IV to delineate the left ventricular endocardial borders. The left ventricular internal cavity size was normal in size. There is no left ventricular hypertrophy. Left ventricular diastolic parameters are consistent with Grade I diastolic dysfunction (impaired relaxation). Right Ventricle: The right ventricular size is moderately enlarged. No increase in right ventricular wall  thickness. Right ventricular systolic function is severely reduced. There is moderately elevated pulmonary artery systolic pressure. The tricuspid regurgitant velocity is 3.49 m/s, and with an assumed right atrial pressure of 3  mmHg, the estimated right ventricular systolic pressure is 51.7 mmHg. Left Atrium: Left atrial size was normal in size. Right Atrium: Right atrial size was severely dilated. Pericardium: There is no evidence of pericardial effusion. Mitral Valve: The mitral valve is normal in structure. No evidence of mitral valve regurgitation. No evidence of mitral valve stenosis. Tricuspid Valve: The tricuspid valve is not well visualized. Tricuspid valve regurgitation is trivial. No evidence of tricuspid stenosis. Aortic Valve: The aortic valve is tricuspid. Aortic valve regurgitation is not visualized. No aortic stenosis is present. Pulmonic Valve: The pulmonic valve was not well visualized. Pulmonic valve regurgitation is trivial. No evidence of pulmonic stenosis. Aorta: The aortic root and ascending aorta are structurally normal, with no evidence of dilitation. Venous: The inferior vena cava is normal in size with greater than 50% respiratory variability, suggesting right atrial pressure of 3 mmHg. IAS/Shunts: No atrial level shunt detected by color flow Doppler.  LEFT VENTRICLE PLAX 2D LVIDd:         3.80 cm   Diastology LVIDs:         3.40 cm   LV e' medial:    2.78 cm/s LV PW:         1.00 cm   LV E/e' medial:  16.6 LV IVS:        0.70 cm   LV e' lateral:   4.58 cm/s LVOT diam:     2.40 cm   LV E/e' lateral: 10.1 LV SV:         50 LV SV Index:   24 LVOT Area:     4.52 cm  RIGHT VENTRICLE RV S prime:     9.48 cm/s TAPSE (M-mode): 2.5 cm LEFT ATRIUM             Index        RIGHT ATRIUM           Index LA diam:        3.80 cm 1.79 cm/m   RA Area:     32.10 cm LA Vol (A2C):   72.2 ml 33.95 ml/m  RA Volume:   123.00 ml 57.84 ml/m LA Vol (A4C):   57.6 ml 27.09 ml/m LA Biplane Vol: 65.4 ml 30.76  ml/m  AORTIC VALVE             PULMONIC VALVE LVOT Vmax:   67.00 cm/s  PR End Diast Vel: 4.67 msec LVOT Vmean:  42.350 cm/s LVOT VTI:    0.111 m  AORTA Ao Root diam: 3.60 cm Ao Asc diam:  3.50 cm MITRAL VALVE               TRICUSPID VALVE MV Area (PHT): 3.46 cm    TR Peak grad:   48.7 mmHg MV Decel Time: 219 msec    TR Vmax:        349.00 cm/s MR Peak grad: 3.8 mmHg MR Vmax:      97.00 cm/s   SHUNTS MV E velocity: 46.10 cm/s  Systemic VTI:  0.11 m MV A velocity: 81.00 cm/s  Systemic Diam: 2.40 cm MV E/A ratio:  0.57 Karis Emig Priya Stepan Verrette Electronically signed by Winfield Rast Carsyn Taubman Signature Date/Time: 12/24/2022/12:12:13 PM    Final      Assessment & Plan   # Acute systolic and diastolic heart failure exacerbation # 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 Echo on admission showed LVEF 25 to 30%, G1 DD  and severe RV dysfunction. 2.6L urine output in the last 24 hours with net -1.7 L on IV Lasix 60 mg twice daily. Since admission, patient had 3.5L urine output. Has mild SOB with ambulation but overall significantly improved compared to admission day, CVP 3 mmHg on 12/24/2022 but ReDs is 40 this a.m. Increase IV Lasix to 80 mg BID (could have dissociation between RA and PCWP). -Switch carvedilol to metoprolol succinate 25 mg once daily to allow room on blood pressure for Entresto initiation. -Stop lisinopril 40 mg, last dose was on 12/25/2022. Will need to switch to Entresto 24-26 mg twice daily upon discharge (okay to use Entresto in CKD stage IV, renal adjusted dose is 24-26 mg twice daily). -Hold off on spironolactone due to CKD stage IV -Not a candidate for LHC due to CKD stage IV, not on HD.  Medical management for cardiomyopathy.  # Paroxysmal A-fib -Switch carvedilol to metoprolol succinate 25 mg once daily and continue Eliquis 2.5 mg twice daily.  # OSA not on CPAP -Intolerant to CPAP, oxygen at night.  Signed, Marjo Bicker, MD  12/26/2022, 9:48 AM

## 2022-12-26 NOTE — Care Management Important Message (Signed)
Important Message  Patient Details  Name: [name] MRN: [id number] Date of Birth: 1940/06/30   Medicare Important Message Given:  N/A - LOS <3 / Initial given by admissions     Corey Harold 12/26/2022, 10:31 AM

## 2022-12-26 NOTE — Progress Notes (Signed)
   12/26/22 1000  ReDS Vest / Clip  Station Marker D  Ruler Value 38  ReDS Value Range 36 - 40  ReDS Actual Value 40

## 2022-12-27 DIAGNOSIS — I10 Essential (primary) hypertension: Secondary | ICD-10-CM | POA: Diagnosis not present

## 2022-12-27 DIAGNOSIS — N400 Enlarged prostate without lower urinary tract symptoms: Secondary | ICD-10-CM | POA: Diagnosis not present

## 2022-12-27 DIAGNOSIS — I5021 Acute systolic (congestive) heart failure: Secondary | ICD-10-CM | POA: Diagnosis not present

## 2022-12-27 LAB — BASIC METABOLIC PANEL
Anion gap: 13 (ref 5–15)
BUN: 53 mg/dL — ABNORMAL HIGH (ref 8–23)
CO2: 20 mmol/L — ABNORMAL LOW (ref 22–32)
Calcium: 8.7 mg/dL — ABNORMAL LOW (ref 8.9–10.3)
Chloride: 101 mmol/L (ref 98–111)
Creatinine, Ser: 3.39 mg/dL — ABNORMAL HIGH (ref 0.61–1.24)
GFR, Estimated: 17 mL/min — ABNORMAL LOW (ref >60)
Glucose, Bld: 100 mg/dL — ABNORMAL HIGH (ref 70–99)
Potassium: 4 mmol/L (ref 3.5–5.1)
Sodium: 134 mmol/L — ABNORMAL LOW (ref 135–145)

## 2022-12-27 LAB — MAGNESIUM: Magnesium: 2.1 mg/dL (ref 1.7–2.4)

## 2022-12-27 MED ORDER — ENTRESTO 24-26 MG PO TABS: 1.0000 | ORAL_TABLET | Freq: Two times a day (BID) | ORAL | 1 refills | Status: DC

## 2022-12-27 MED ORDER — METOPROLOL SUCCINATE ER 25 MG PO TB24: 25.0000 mg | ORAL_TABLET | Freq: Every day | ORAL | Status: DC

## 2022-12-27 MED ORDER — TORSEMIDE 20 MG PO TABS
ORAL_TABLET | ORAL | Status: AC
  Filled 2022-12-27: qty 2

## 2022-12-27 MED ORDER — HYDROXYCHLOROQUINE SULFATE 200 MG PO TABS
ORAL_TABLET | ORAL | Status: AC
  Filled 2022-12-27: qty 2

## 2022-12-27 MED ORDER — TORSEMIDE 20 MG PO TABS
40.0000 mg | ORAL_TABLET | Freq: Two times a day (BID) | ORAL | Status: DC
  Administered 2022-12-27: 40 mg via ORAL

## 2022-12-27 MED ORDER — TORSEMIDE 40 MG PO TABS: 40.0000 mg | ORAL_TABLET | Freq: Two times a day (BID) | ORAL | 1 refills | Status: DC

## 2022-12-27 MED ORDER — APIXABAN 2.5 MG PO TABS: 2.5000 mg | ORAL_TABLET | Freq: Two times a day (BID) | ORAL | 1 refills | Status: DC

## 2022-12-27 MED ORDER — HYDROXYCHLOROQUINE SULFATE 200 MG PO TABS
400.0000 mg | ORAL_TABLET | Freq: Every day | ORAL | Status: DC
  Administered 2022-12-27: 400 mg via ORAL

## 2022-12-27 NOTE — Plan of Care (Signed)
  Problem: Education: Goal: Knowledge of General Education information will improve Description Including pain rating scale, medication(s)/side effects and non-pharmacologic comfort measures Outcome: Progressing   

## 2022-12-27 NOTE — Progress Notes (Signed)
Progress Note  Patient Name: [name] Date of Encounter: 12/27/2022  Primary Cardiologist: None  Subjective   No acute events overnight. No SOB with ambulation.  Inpatient Medications    Scheduled Meds:  albuterol  2.5 mg Nebulization TID   apixaban  2.5 mg Oral BID   diclofenac Sodium  2 g Topical QID   furosemide  80 mg Intravenous BID   hydroxychloroquine  400 mg Oral Daily   melatonin  3 mg Oral QHS   metoprolol succinate  25 mg Oral Daily   mometasone-formoterol  2 puff Inhalation BID   pantoprazole  40 mg Oral Daily   tamsulosin  0.4 mg Oral QPC supper   traZODone  25 mg Oral QHS   umeclidinium bromide  1 puff Inhalation Daily   Continuous Infusions:  PRN Meds: acetaminophen **OR** acetaminophen, albuterol, ondansetron **OR** ondansetron (ZOFRAN) IV, phenol   Vital Signs    Vitals:   12/26/22 1959 12/26/22 2002 12/27/22 0417 12/27/22 0554  BP:  105/63 (!) 107/45 116/64  Pulse:  83 97   Resp:  20 20   Temp:   97.9 F (36.6 C)   TempSrc:   Oral   SpO2: 95% 100% 96%   Weight:   99.6 kg   Height:        Intake/Output Summary (Last 24 hours) at 12/27/2022 0927 Last data filed at 12/27/2022 0300 Gross per 24 hour  Intake 240 ml  Output 1000 ml  Net -760 ml   Filed Weights   12/25/22 0500 12/26/22 0500 12/27/22 0417  Weight: 100.1 kg 99.3 kg 99.6 kg    Telemetry     Personally reviewed, NSR.  ECG    Not performed today  Physical Exam   GEN: No acute distress.   Neck: JVD not examined due to body habitus Cardiac: RRR, no murmur, rub, or gallop.  Respiratory: Nonlabored. Clear to auscultation bilaterally. GI: Soft, nontender, bowel sounds present. MS: No edema; No deformity. Neuro:  Nonfocal. Psych: Alert and oriented x 3. Normal affect.  Labs    Chemistry Recent Labs  Lab 12/24/22 0556 12/25/22 0421 12/26/22 0405 12/27/22 0458  NA 135 135 133* 134*  K 4.2 4.3 3.9 4.0  CL 103 102 100 101  CO2 22 24 22  20*  GLUCOSE 91 113*  121* 100*  BUN 41* 43* 45* 53*  CREATININE 3.23* 3.19* 3.16* 3.39*  CALCIUM 8.9 9.1 8.7* 8.7*  PROT 7.1  --   --   --   ALBUMIN 3.2* 3.3*  --   --   AST 22  --   --   --   ALT 18  --   --   --   ALKPHOS 57  --   --   --   BILITOT 0.9  --   --   --   GFRNONAA 19* 19* 19* 17*  ANIONGAP 10 9 11 13      Hematology Recent Labs  Lab 12/23/22 1552 12/24/22 0556 12/26/22 0405  WBC 10.9* 11.3* 14.5*  RBC 4.13* 4.02* 3.91*  HGB 12.0* 11.6* 11.4*  HCT 39.3 37.9* 36.0*  MCV 95.2 94.3 92.1  MCH 29.1 28.9 29.2  MCHC 30.5 30.6 31.7  RDW 16.4* 16.2* 16.2*  PLT 243 228 266    Cardiac Enzymes Recent Labs  Lab 12/23/22 1735 12/23/22 2011 12/23/22 2222 12/24/22 0002  TROPONINIHS 17 19* 19* 20*    BNP Recent Labs  Lab 12/23/22 1735  BNP 293.0*  DDimerNo results for input(s): "DDIMER" in the last 168 hours.   Radiology    DG CHEST PORT 1 VIEW  Result Date: 12/26/2022 CLINICAL DATA:  Cough, pulmonary fibrosis EXAM: PORTABLE CHEST 1 VIEW COMPARISON:  Previous studies including CT done on 12/23/2022 FINDINGS: Transverse diameter of heart is increased. Low lung volumes. There are no signs of pulmonary edema or focal pulmonary consolidation. There is no significant pleural effusion or pneumothorax. IMPRESSION: Cardiomegaly. There are no signs of pulmonary edema or new focal pulmonary consolidation. Electronically Signed   By: Ernie Avena M.D.   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 Echo on admission showed LVEF 25 to 30%, G1 DD and severe RV dysfunction. 1L urine output in the last 24 hours on IV Lasix 80 mg BID, with net -ve 500 mL. No SOB with ambulation. -Continue Metoprolol succinate 25 mg once daily -Last dose of Lisinopril was on 12/25/2022. Start Entresto 24-26 mg BID upon discharge (from  12/28/22) -Hold off on spironolactone due to CKD stage IV -Not a candidate for LHC due to CKD stage IV, not on HD.  Medical management for cardiomyopathy. -Follow up with his cardiologist at [facility]  # Paroxysmal A-fib, NSR currently -Continue metoprolol succinate 25 mg once daily and continue Eliquis 2.5 mg twice daily.  # OSA not on CPAP -Intolerant to CPAP, oxygen at night.  Disposition: Discharge to home today  [facility] will sign off.   Medication Recommendations:  Torsemide 40 mg BID, Metoprolol succinate 25 mg once daily, Entresto 24-26 mg BID (from 12/28/22), Eliquis 2.5 mg BID. Other recommendations (labs, testing, etc):  None Follow up as an outpatient:  Follow up with his private cardiologist at [facility].   Signed, Marjo Bicker, MD  12/27/2022, 9:27 AM

## 2022-12-27 NOTE — Discharge Summary (Signed)
Physician Discharge Summary  [name] ZOX:[id number] DOB: Jun 13, 1940 DOA: 12/23/2022  PCP: Assunta Found, MD  Admit date: 12/23/2022 Discharge date: 12/27/2022  Admitted From:  Home  Disposition: Home   Recommendations for Outpatient Follow-up:  Follow up with PCP in 1 weeks [facility] Please obtain BMP/CBC in 1 week  Discharge Condition: STABLE   CODE STATUS: FULL DIET: heart healthy    Brief Hospitalization Summary: Please see all hospital notes, images, labs for full details of the hospitalization. ADMISSION PROVIDER HPI:  82 y.o. male with medical history significant of COPD on supplemental oxygen via [place] at 2 LPM as needed, CHF, hypertension, hyperlipidemia, GERD, BPH, atrial fibrillation on Eliquis who presents to the emergency department due to worsening shortness of breath that has been ongoing for about 10 days and require consistent use of oxygen at all times (change from baseline).  Shortness of breath was associated with white sputum with occasional streaks of blood and no frank hemoptysis.  He complained of 3-week onset of leg swelling and recent gain of 5 pounds.  Patient denies chest pain, fever, chills.  [facility] and was asked to go to the nearest ED for further evaluation.  He was admitted with acute HFrEF.    HOSPITAL COURSE BY PROBLEM   Acute HFrEF - he remains volume overloaded - treated with IV furosemide with good results  - he was started on metoprolol succinate 25 mg daily by cardiology  - monitoring I/Os, weights closely  - reDS protocol reading:   Filed Weights   12/25/22 0500 12/26/22 0500 12/27/22 0417  Weight: 100.1 kg 99.3 kg 99.6 kg    Intake/Output Summary (Last 24 hours) at 12/27/2022 1000 Last data filed at 12/27/2022 0300 Gross per 24 hour  Intake 240 ml  Output 1000 ml  Net -760 ml  -cardiology recommending DC home today on torsemide 40 mg  BID -they recommended starting entresto 24-26 BID starting 12/28/22  -pt to follow up with his private cardiology at [facility]  Acute on chronic respiratory failure with hypoxia  - secondary to acute HFrEF - improving with diuresis - weaned oxygen to baseline   pAF - stable  - he is fully anticoagulated on apixaban  - his HR is controlled on metoprolol succinate 25 mg daily    COPD - no s/s of acute exacerbation  - continue bronchodilators  - supplemental oxygen as needed   BPH - stable on home tamsulosin - added PRN order for bladder scan and I/O cath    Physical deconditioning - PT eval  - ambulate    Discharge Diagnoses:  Principal Problem:   Congestive heart failure (CHF) (HCC) Active Problems:   Persistent atrial fibrillation (HCC)   COPD (chronic obstructive pulmonary disease) (HCC)   GERD   Elevated troponin   Essential hypertension   Mixed hyperlipidemia   Obesity (BMI 30-39.9)   BPH (benign prostatic hyperplasia)   Discharge Instructions:  Allergies as of 12/27/2022       Reactions   Codeine Other (See Comments)   Hallucinations   Lipitor [atorvastatin]    Muscle pain        Medication List     STOP taking these medications    atorvastatin 80 MG tablet Commonly known as: LIPITOR   carvedilol 12.5 MG tablet Commonly known as: COREG   lisinopril 40 MG tablet Commonly known as: ZESTRIL   mometasone-formoterol  200-5 MCG/ACT Aero Commonly known as: Dulera   pantoprazole 40 MG tablet Commonly known as: PROTONIX   tiotropium 18 MCG inhalation capsule Commonly known as: SPIRIVA       TAKE these medications    albuterol (2.5 MG/3ML) 0.083% nebulizer solution Commonly known as: PROVENTIL Take 3 mLs (2.5 mg total) by nebulization 3 (three) times daily. What changed: Another medication with the same name was changed. Make sure you understand how and when to take each.   albuterol 108 (90 Base) MCG/ACT inhaler Commonly known as: ProAir  HFA Inhale 1-2 puffs into the lungs 2 (two) times daily. What changed:  when to take this reasons to take this   apixaban 2.5 MG Tabs tablet Commonly known as: ELIQUIS Take 1 tablet (2.5 mg total) by mouth 2 (two) times daily. What changed:  medication strength how much to take   Entresto 24-26 MG Generic drug: sacubitril-valsartan Take 1 tablet by mouth 2 (two) times daily. Start taking on: December 28, 2022   gabapentin 100 MG capsule Commonly known as: NEURONTIN Take by mouth.   hydroxychloroquine 200 MG tablet Commonly known as: PLAQUENIL Take 400 mg by mouth daily.   melatonin 3 MG Tabs tablet Take 1 tablet by mouth at bedtime.   metoprolol succinate 25 MG 24 hr tablet Commonly known as: TOPROL-XL Take 1 tablet (25 mg total) by mouth daily. What changed: how much to take   multivitamin tablet Take 1 tablet by mouth daily.   tamsulosin 0.4 MG Caps capsule Commonly known as: FLOMAX Take 0.4 mg by mouth daily after supper.   Tiotropium Bromide-Olodaterol 2.5-2.5 MCG/ACT Aers Inhale 2 puffs into the lungs daily.   Torsemide 40 MG Tabs Take 40 mg by mouth 2 (two) times daily. What changed:  medication strength when to take this   traZODone 100 MG tablet Commonly known as: DESYREL Take 100 mg by mouth at bedtime.   Ubrogepant 100 MG Tabs Take 100 mg by mouth as needed (migraine).        Allergies  Allergen Reactions   Codeine Other (See Comments)    Hallucinations   Lipitor [Atorvastatin]     Muscle pain   Allergies as of 12/27/2022       Reactions   Codeine Other (See Comments)   Hallucinations   Lipitor [atorvastatin]    Muscle pain        Medication List     STOP taking these medications    atorvastatin 80 MG tablet Commonly known as: LIPITOR   carvedilol 12.5 MG tablet Commonly known as: COREG   lisinopril 40 MG tablet Commonly known as: ZESTRIL   mometasone-formoterol 200-5 MCG/ACT Aero Commonly known as: Dulera    pantoprazole 40 MG tablet Commonly known as: PROTONIX   tiotropium 18 MCG inhalation capsule Commonly known as: SPIRIVA       TAKE these medications    albuterol (2.5 MG/3ML) 0.083% nebulizer solution Commonly known as: PROVENTIL Take 3 mLs (2.5 mg total) by nebulization 3 (three) times daily. What changed: Another medication with the same name was changed. Make sure you understand how and when to take each.   albuterol 108 (90 Base) MCG/ACT inhaler Commonly known as: ProAir HFA Inhale 1-2 puffs into the lungs 2 (two) times daily. What changed:  when to take this reasons to take this   apixaban 2.5 MG Tabs tablet Commonly known as: ELIQUIS Take 1 tablet (2.5 mg total) by mouth 2 (two) times daily. What changed:  medication strength how  much to take   Entresto 24-26 MG Generic drug: sacubitril-valsartan Take 1 tablet by mouth 2 (two) times daily. Start taking on: December 28, 2022   gabapentin 100 MG capsule Commonly known as: NEURONTIN Take by mouth.   hydroxychloroquine 200 MG tablet Commonly known as: PLAQUENIL Take 400 mg by mouth daily.   melatonin 3 MG Tabs tablet Take 1 tablet by mouth at bedtime.   metoprolol succinate 25 MG 24 hr tablet Commonly known as: TOPROL-XL Take 1 tablet (25 mg total) by mouth daily. What changed: how much to take   multivitamin tablet Take 1 tablet by mouth daily.   tamsulosin 0.4 MG Caps capsule Commonly known as: FLOMAX Take 0.4 mg by mouth daily after supper.   Tiotropium Bromide-Olodaterol 2.5-2.5 MCG/ACT Aers Inhale 2 puffs into the lungs daily.   Torsemide 40 MG Tabs Take 40 mg by mouth 2 (two) times daily. What changed:  medication strength when to take this   traZODone 100 MG tablet Commonly known as: DESYREL Take 100 mg by mouth at bedtime.   Ubrogepant 100 MG Tabs Take 100 mg by mouth as needed (migraine).        Procedures/Studies: DG CHEST PORT 1 VIEW  Result Date: 12/26/2022 CLINICAL DATA:   Cough, pulmonary fibrosis EXAM: PORTABLE CHEST 1 VIEW COMPARISON:  Previous studies including CT done on 12/23/2022 FINDINGS: Transverse diameter of heart is increased. Low lung volumes. There are no signs of pulmonary edema or focal pulmonary consolidation. There is no significant pleural effusion or pneumothorax. IMPRESSION: Cardiomegaly. There are no signs of pulmonary edema or new focal pulmonary consolidation. Electronically Signed   By: Ernie Avena M.D.   On: 12/26/2022 08:46   ECHOCARDIOGRAM COMPLETE  Result Date: 12/24/2022    ECHOCARDIOGRAM REPORT   Patient Name:   [name] Date of Exam: 12/24/2022 Medical Rec #:  [id number]   Height:       67.0 in Accession #:    [id number]  Weight:       225.1 lb Date of Birth:  [birth year]   BSA:          2.126 m Patient Age:    81 years    BP:           121/79 mmHg Patient Gender: M           HR:           71 bpm. [facility] Procedure: 2D Echo, Intracardiac Opacification Agent, Cardiac Doppler and Color            Doppler Indications:    CHF-Acute Diastolic I50.31  History:        Patient has no prior history of Echocardiogram examinations.                 CHF, COPD, Arrythmias:Atrial Fibrillation; Risk                 Factors:Hypertension, Dyslipidemia, Former Smoker and Sleep                 Apnea.  Sonographer:    Aron Baba Referring Phys: [id number] OLADAPO ADEFESO  Sonographer Comments: Patient is obese and Technically difficult study due to poor echo windows. IMPRESSIONS  1. Left ventricular ejection fraction, by estimation, is 25 to 30%. The left ventricle has moderate to severely decreased function. The left ventricle demonstrates global hypokinesis. Left ventricular diastolic parameters are consistent with Grade I diastolic dysfunction (impaired relaxation).  2. Right ventricular systolic  function is severely reduced. The right ventricular size is moderately enlarged. There is moderately elevated pulmonary artery systolic pressure.  The estimated right ventricular systolic pressure is 51.7 mmHg.  3. Right atrial size was severely dilated.  4. The mitral valve is normal in structure. No evidence of mitral valve regurgitation. No evidence of mitral stenosis.  5. The aortic valve is tricuspid. Aortic valve regurgitation is not visualized. No aortic stenosis is present.  6. The inferior vena cava is normal in size with greater than 50% respiratory variability, suggesting right atrial pressure of 3 mmHg. Comparison(s): No prior Echocardiogram. FINDINGS  Left Ventricle: Left ventricular ejection fraction, by estimation, is 25 to 30%. The left ventricle has moderate to severely decreased function. The left ventricle demonstrates global hypokinesis. Definity contrast agent was given IV to delineate the left ventricular endocardial borders. The left ventricular internal cavity size was normal in size. There is no left ventricular hypertrophy. Left ventricular diastolic parameters are consistent with Grade I diastolic dysfunction (impaired relaxation). Right Ventricle: The right ventricular size is moderately enlarged. No increase in right ventricular wall thickness. Right ventricular systolic function is severely reduced. There is moderately elevated pulmonary artery systolic pressure. The tricuspid regurgitant velocity is 3.49 m/s, and with an assumed right atrial pressure of 3 mmHg, the estimated right ventricular systolic pressure is 51.7 mmHg. Left Atrium: Left atrial size was normal in size. Right Atrium: Right atrial size was severely dilated. Pericardium: There is no evidence of pericardial effusion. Mitral Valve: The mitral valve is normal in structure. No evidence of mitral valve regurgitation. No evidence of mitral valve stenosis. Tricuspid Valve: The tricuspid valve is not well visualized. Tricuspid valve regurgitation is trivial. No evidence of tricuspid stenosis. Aortic Valve: The aortic valve is tricuspid. Aortic valve regurgitation is not  visualized. No aortic stenosis is present. Pulmonic Valve: The pulmonic valve was not well visualized. Pulmonic valve regurgitation is trivial. No evidence of pulmonic stenosis. Aorta: The aortic root and ascending aorta are structurally normal, with no evidence of dilitation. Venous: The inferior vena cava is normal in size with greater than 50% respiratory variability, suggesting right atrial pressure of 3 mmHg. IAS/Shunts: No atrial level shunt detected by color flow Doppler.  LEFT VENTRICLE PLAX 2D LVIDd:         3.80 cm   Diastology LVIDs:         3.40 cm   LV e' medial:    2.78 cm/s LV PW:         1.00 cm   LV E/e' medial:  16.6 LV IVS:        0.70 cm   LV e' lateral:   4.58 cm/s LVOT diam:     2.40 cm   LV E/e' lateral: 10.1 LV SV:         50 LV SV Index:   24 LVOT Area:     4.52 cm  RIGHT VENTRICLE RV S prime:     9.48 cm/s TAPSE (M-mode): 2.5 cm LEFT ATRIUM             Index        RIGHT ATRIUM           Index LA diam:        3.80 cm 1.79 cm/m   RA Area:     32.10 cm LA Vol (A2C):   72.2 ml 33.95 ml/m  RA Volume:   123.00 ml 57.84 ml/m LA Vol (A4C):   57.6 ml 27.09 ml/m LA  Biplane Vol: 65.4 ml 30.76 ml/m  AORTIC VALVE             PULMONIC VALVE LVOT Vmax:   67.00 cm/s  PR End Diast Vel: 4.67 msec LVOT Vmean:  42.350 cm/s LVOT VTI:    0.111 m  AORTA Ao Root diam: 3.60 cm Ao Asc diam:  3.50 cm MITRAL VALVE               TRICUSPID VALVE MV Area (PHT): 3.46 cm    TR Peak grad:   48.7 mmHg MV Decel Time: 219 msec    TR Vmax:        349.00 cm/s MR Peak grad: 3.8 mmHg MR Vmax:      97.00 cm/s   SHUNTS MV E velocity: 46.10 cm/s  Systemic VTI:  0.11 m MV A velocity: 81.00 cm/s  Systemic Diam: 2.40 cm MV E/A ratio:  0.57 Vishnu Priya Mallipeddi Electronically signed by Winfield Rast Mallipeddi Signature Date/Time: 12/24/2022/12:12:13 PM    Final    CT Chest Wo Contrast  Result Date: 12/23/2022 CLINICAL DATA:  Pneumonia with complication suspected. X-ray done. Shortness of breath for 2 weeks. EXAM: CT CHEST  WITHOUT CONTRAST TECHNIQUE: Multidetector CT imaging of the chest was performed following the standard protocol without IV contrast. RADIATION DOSE REDUCTION: This exam was performed according to the departmental dose-optimization program which includes automated exposure control, adjustment of the mA and/or kV according to patient size and/or use of iterative reconstruction technique. COMPARISON:  Chest radiograph 12/23/2022 FINDINGS: Cardiovascular: Normal heart size. No pericardial effusions. Normal caliber thoracic aorta. Calcification of the aorta and coronary arteries. Mediastinum/Nodes: Thyroid gland is unremarkable. Esophagus is decompressed. Prominent mediastinal lymph nodes. Largest pretracheal lymph nodes measure 1.9 cm in short axis dimension. Lungs/Pleura: Emphysematous changes in the lungs. Scarring in the lung bases. This likely accounts for the opacity seen at prior chest radiograph. No consolidation or airspace disease. No pleural effusions. No pneumothorax. Upper Abdomen: Cholelithiasis. 5.6 cm diameter cyst in the upper pole left kidney. No imaging follow-up is indicated. Musculoskeletal: Degenerative changes in the spine. No destructive bone lesions. IMPRESSION: 1. Emphysematous changes and fibrosis in the lungs likely account for chest radiographic findings. No evidence of pneumonia or consolidation. 2. Moderate mediastinal lymphadenopathy of indeterminate etiology. Changes may be reactive or could indicate lymphoproliferative disorder. Clinical follow-up is suggested. 3. Aortic atherosclerosis. 4. Cholelithiasis. Electronically Signed   By: Burman Nieves M.D.   On: 12/23/2022 18:31   DG Chest 2 View  Result Date: 12/23/2022 CLINICAL DATA:  COPD EXAM: CHEST - 2 VIEW COMPARISON:  12/10/2015. FINDINGS: There are nodular opacities at bilateral lung bases, left greater than right, which are indeterminate and may represent pneumonia versus artifacts. Correlate clinically to determine the need  for further imaging with chest CT scan. Bilateral costophrenic angles are clear. Stable cardio-mediastinal silhouette. No acute osseous abnormalities. The soft tissues are within normal limits. IMPRESSION: *Nodular opacities at bilateral lung bases, left greater than right, which are indeterminate and may represent pneumonia versus artifacts. Electronically Signed   By: Jules Schick M.D.   On: 12/23/2022 15:34     Subjective: Pt reports that he is feeling much better today, no SOB, no CP, he has been diuresing with furosemide  Discharge Exam: Vitals:   12/27/22 0554 12/27/22 0927  BP: 116/64   Pulse:    Resp:    Temp:    SpO2:  94%   Vitals:   12/26/22 2002 12/27/22 0417 12/27/22 0554 12/27/22 6295  BP: 105/63 (!) 107/45 116/64   Pulse: 83 97    Resp: 20 20    Temp:  97.9 F (36.6 C)    TempSrc:  Oral    SpO2: 100% 96%  94%  Weight:  99.6 kg    Height:        General: Pt is alert, awake, not in acute distress Cardiovascular: normal S1/S2 +, no rubs, no gallops Respiratory: CTA bilaterally, no wheezing, no rhonchi Abdominal: Soft, NT, ND, bowel sounds + Extremities: trace edema, no cyanosis   The results of significant diagnostics from this hospitalization (including imaging, microbiology, ancillary and laboratory) are listed below for reference.     Microbiology: No results found for this or any previous visit (from the past 240 hour(s)).   Labs: BNP (last 3 results) Recent Labs    12/23/22 1735  BNP 293.0*   Basic Metabolic Panel: Recent Labs  Lab 12/23/22 1552 12/24/22 0556 12/25/22 0421 12/26/22 0405 12/27/22 0458  NA 134* 135 135 133* 134*  K 4.4 4.2 4.3 3.9 4.0  CL 102 103 102 100 101  CO2 23 22 24 22  20*  GLUCOSE 89 91 113* 121* 100*  BUN 40* 41* 43* 45* 53*  CREATININE 3.21* 3.23* 3.19* 3.16* 3.39*  CALCIUM 8.6* 8.9 9.1 8.7* 8.7*  MG  --  2.0  --  2.0 2.1  PHOS  --  3.7 3.9  --   --    Liver Function Tests: Recent Labs  Lab 12/24/22 0556  12/25/22 0421  AST 22  --   ALT 18  --   ALKPHOS 57  --   BILITOT 0.9  --   PROT 7.1  --   ALBUMIN 3.2* 3.3*   No results for input(s): "LIPASE", "AMYLASE" in the last 168 hours. No results for input(s): "AMMONIA" in the last 168 hours. CBC: Recent Labs  Lab 12/23/22 1552 12/24/22 0556 12/26/22 0405  WBC 10.9* 11.3* 14.5*  HGB 12.0* 11.6* 11.4*  HCT 39.3 37.9* 36.0*  MCV 95.2 94.3 92.1  PLT 243 228 266   Cardiac Enzymes: No results for input(s): "CKTOTAL", "CKMB", "CKMBINDEX", "TROPONINI" in the last 168 hours. BNP: Invalid input(s): "POCBNP" CBG: No results for input(s): "GLUCAP" in the last 168 hours. D-Dimer No results for input(s): "DDIMER" in the last 72 hours. Hgb A1c No results for input(s): "HGBA1C" in the last 72 hours. Lipid Profile No results for input(s): "CHOL", "HDL", "LDLCALC", "TRIG", "CHOLHDL", "LDLDIRECT" in the last 72 hours. Thyroid function studies No results for input(s): "TSH", "T4TOTAL", "T3FREE", "THYROIDAB" in the last 72 hours.  Invalid input(s): "FREET3" Anemia work up No results for input(s): "VITAMINB12", "FOLATE", "FERRITIN", "TIBC", "IRON", "RETICCTPCT" in the last 72 hours. Urinalysis No results found for: "COLORURINE", "APPEARANCEUR", "LABSPEC", "PHURINE", "GLUCOSEU", "HGBUR", "BILIRUBINUR", "KETONESUR", "PROTEINUR", "UROBILINOGEN", "NITRITE", "LEUKOCYTESUR" Sepsis Labs Recent Labs  Lab 12/23/22 1552 12/24/22 0556 12/26/22 0405  WBC 10.9* 11.3* 14.5*   Microbiology No results found for this or any previous visit (from the past 240 hour(s)).  Time coordinating discharge: 41 mins   SIGNED:  Standley Dakins, MD  [facility] 12/27/2022, 9:57 AM How to contact the [facility] Attending or Consulting provider 7A - 7P or covering provider during after hours 7P -7A, for this patient?  Check the care team in [facility] and look for a) attending/consulting [facility] provider listed and b) the [facility] team listed Log into [email or web address] and use [facility]'s universal password to access. If you do not have the password, please contact the  hospital operator. Locate the [facility] provider you are looking for under [facility] and page to a number that you can be directly reached. If you still have difficulty reaching the provider, please page the [facility] (Director on Call) for the Hospitalists listed on amion for assistance.

## 2022-12-31 NOTE — Consult Note (Signed)
[facility] Customer service manager [facility]) Accountable Care Organization (ACO) [facility] Hospital Liaison Note  12/31/2022  [name] [birth year] [id number]  Location: [facility] RN Hospital Liaison screened the patient remotely at [facility].  Insurance: [facility] HMO   [name] is a 82 y.o. male who is a Primary Care Patient of Assunta Found, MD. The patient was screened for readmission hospitalization with noted medium risk score for unplanned readmission risk with 1 IP in 6 months.  The patient was assessed for potential [facility]) Care Management service needs for post hospital transition for care coordination. Review of patient's electronic medical record reveals patient was admitted for CHF. No anticipated needs at this time as pt discharged home.    [facility] Care Management/Population Health does not replace or interfere with any arrangements made by the Inpatient Transition of Care team.   For questions contact:   Elliot Cousin, RN, [facility] Liaison [place]   Population Health Office Hours MTWF  8:00 am-6:00 pm Off on Thursday [phone] mobile [phone] [Office toll free line] Office Hours are M-F 8:30 - 5 pm 24 hour nurse advise line [phone] Concierge  [email or web address]

## 2023-04-04 ENCOUNTER — Emergency Department (HOSPITAL_COMMUNITY)
Admission: EM | Admit: 2023-04-04 | Discharge: 2023-04-11 | Disposition: E | Payer: No Typology Code available for payment source | Attending: Emergency Medicine | Admitting: Emergency Medicine

## 2023-04-04 ENCOUNTER — Encounter (HOSPITAL_COMMUNITY): Payer: Self-pay

## 2023-04-04 ENCOUNTER — Other Ambulatory Visit: Payer: Self-pay

## 2023-04-04 DIAGNOSIS — R0603 Acute respiratory distress: Secondary | ICD-10-CM

## 2023-04-04 DIAGNOSIS — I509 Heart failure, unspecified: Secondary | ICD-10-CM | POA: Diagnosis not present

## 2023-04-04 DIAGNOSIS — I469 Cardiac arrest, cause unspecified: Secondary | ICD-10-CM | POA: Insufficient documentation

## 2023-04-04 DIAGNOSIS — Z7901 Long term (current) use of anticoagulants: Secondary | ICD-10-CM | POA: Diagnosis not present

## 2023-04-04 DIAGNOSIS — M7989 Other specified soft tissue disorders: Secondary | ICD-10-CM | POA: Diagnosis not present

## 2023-04-04 DIAGNOSIS — Z7951 Long term (current) use of inhaled steroids: Secondary | ICD-10-CM | POA: Diagnosis not present

## 2023-04-04 DIAGNOSIS — J449 Chronic obstructive pulmonary disease, unspecified: Secondary | ICD-10-CM | POA: Insufficient documentation

## 2023-04-04 DIAGNOSIS — E119 Type 2 diabetes mellitus without complications: Secondary | ICD-10-CM | POA: Diagnosis not present

## 2023-04-04 DIAGNOSIS — I2510 Atherosclerotic heart disease of native coronary artery without angina pectoris: Secondary | ICD-10-CM | POA: Insufficient documentation

## 2023-04-04 DIAGNOSIS — R600 Localized edema: Secondary | ICD-10-CM | POA: Diagnosis not present

## 2023-04-04 DIAGNOSIS — I4891 Unspecified atrial fibrillation: Secondary | ICD-10-CM | POA: Diagnosis not present

## 2023-04-04 LAB — CBG MONITORING, ED: Glucose-Capillary: 117 mg/dL — ABNORMAL HIGH (ref 70–99)

## 2023-04-05 LAB — I-STAT CHEM 8, ED
BUN: 72 mg/dL — ABNORMAL HIGH (ref 8–23)
Calcium, Ion: 1.04 mmol/L — ABNORMAL LOW (ref 1.15–1.40)
Chloride: 111 mmol/L (ref 98–111)
Creatinine, Ser: 3.3 mg/dL — ABNORMAL HIGH (ref 0.61–1.24)
Glucose, Bld: 180 mg/dL — ABNORMAL HIGH (ref 70–99)
HCT: 36 % — ABNORMAL LOW (ref 39.0–52.0)
Hemoglobin: 12.2 g/dL — ABNORMAL LOW (ref 13.0–17.0)
Potassium: 7.3 mmol/L (ref 3.5–5.1)
Sodium: 136 mmol/L (ref 135–145)
TCO2: 17 mmol/L — ABNORMAL LOW (ref 22–32)

## 2023-04-09 ENCOUNTER — Encounter (HOSPITAL_COMMUNITY): Payer: Non-veteran care

## 2023-04-10 ENCOUNTER — Encounter (HOSPITAL_COMMUNITY): Payer: Non-veteran care

## 2023-04-11 NOTE — ED Notes (Signed)
1830 CPR started in field. 1855 pulse check - PEA 1856 1 mg epi given 1858 pulse check - PEA 1 mg epi given 1900 pulse check PEA 1902 LMA/ pulse check PEA  1904 pulse check PEA 1906 Time of death

## 2023-04-11 NOTE — ED Triage Notes (Signed)
Pt BIB EMS CPR in progress. Per EMS pt was on the way to the hospital for SOB and collapsed in the parking lot outside the apartment. Per EMS bystander initiated CPR and upon their arrival wife wanted them to continue CPR. After placed on the monitor pt continued to have PEA. No access on arrival. CPR continued in ED room.

## 2023-04-11 NOTE — ED Provider Notes (Signed)
[place] EMERGENCY DEPARTMENT AT [facility] Provider Note   CSN: [id number] Arrival date & time: 03/25/2023  9563     History {Add pertinent medical, surgical, social history, OB history to HPI:1} Chief Complaint  Patient presents with   Cardiac Arrest    [name] is a 82 y.o. male.  82 year old male with a history of CAD, COPD, diabetes, atrial fibrillation on Eliquis, and CHF who presents emergency department in cardiac arrest.  History obtained by EMS reports that he was complaining of shortness of breath and collapse.  Bystanders performed CPR that started at 1830.  When EMS arrived the patient did have a DNR but his wife wanted them to continue resuscitation for the time being.  They were unable to get access or give medications.  They have been bagging him and performing compressions.  Additional history obtained from his wife who states that over the past few days has been having some shortness of breath and cough.  They are getting ready to take him to an appointment when he collapsed.  Has not been complaining of any chest pain at all recently.         Home Medications Prior to Admission medications   Medication Sig Start Date End Date Taking? Authorizing Provider  albuterol (PROAIR HFA) 108 (90 BASE) MCG/ACT inhaler Inhale 1-2 puffs into the lungs 2 (two) times daily. Patient taking differently: Inhale 1-2 puffs into the lungs every 4 (four) hours as needed for wheezing. 07/17/11   Storm Frisk, MD  albuterol (PROVENTIL) (2.5 MG/3ML) 0.083% nebulizer solution Take 3 mLs (2.5 mg total) by nebulization 3 (three) times daily. 07/17/11   Storm Frisk, MD  apixaban (ELIQUIS) 2.5 MG TABS tablet Take 1 tablet (2.5 mg total) by mouth 2 (two) times daily. 12/27/22   Johnson, Clanford L, MD  gabapentin (NEURONTIN) 100 MG capsule Take by mouth. 11/25/22   [provider]  hydroxychloroquine (PLAQUENIL) 200 MG tablet Take 400 mg by mouth daily. 11/25/22    [provider]  Melatonin 3 MG TABS Take 1 tablet by mouth at bedtime.    [provider]  metoprolol succinate (TOPROL-XL) 25 MG 24 hr tablet Take 1 tablet (25 mg total) by mouth daily. 12/27/22   Cleora Fleet, MD  Multiple Vitamin (MULTIVITAMIN) tablet Take 1 tablet by mouth daily.    [provider]  sacubitril-valsartan (ENTRESTO) 24-26 MG Take 1 tablet by mouth 2 (two) times daily. 12/28/22   Johnson, Clanford L, MD  tamsulosin (FLOMAX) 0.4 MG CAPS capsule Take 0.4 mg by mouth daily after supper.    [provider]  Tiotropium Bromide-Olodaterol 2.5-2.5 MCG/ACT AERS Inhale 2 puffs into the lungs daily. 11/11/22   [provider]  torsemide 40 MG TABS Take 40 mg by mouth 2 (two) times daily. 12/27/22   Johnson, Clanford L, MD  traZODone (DESYREL) 100 MG tablet Take 100 mg by mouth at bedtime.    [provider]  Ubrogepant 100 MG TABS Take 100 mg by mouth as needed (migraine). 11/11/22   [provider]      Allergies    Codeine and Lipitor [atorvastatin]    Review of Systems   Review of Systems  Physical Exam Updated Vital Signs BP (!) 0/0   Pulse (!) 0   Ht 5\' 7"  (1.702 m)   Wt 99.6 kg   SpO2 (!) 0%   BMI 34.39 kg/m  Physical Exam Constitutional:      Comments:  Unresponsive.  CPR in progress.  Patient being bagged.  Eyes:     Comments: Pupils 5 mm and fixed  Cardiovascular:     Comments: PEA on monitor no pulses palpated. Pulmonary:     Effort: Respiratory distress present.     Comments: Diffuse rhonchi bilaterally Musculoskeletal:     Right lower leg: Edema (1+) present.     Left lower leg: Edema (3+) present.     ED Results / Procedures / Treatments   Labs (all labs ordered are listed, but only abnormal results are displayed) Labs Reviewed  CBG MONITORING, ED - Abnormal; Notable for the following components:      Result Value   Glucose-Capillary 117 (*)    All other components within normal  limits    EKG None  Radiology No results found.  Procedures CPR  Date/Time: 03/16/2023 7:15 PM  Performed by: Rondel Baton, MD Authorized by: Rondel Baton, MD  CPR Procedure Details:      Amount of time prior to administration of ACLS/BLS (minutes):  20   Duration of CPR (minutes):  20   Outcome: Pt declared dead    CPR performed via ACLS guidelines under my direct supervision.  See RN documentation for details including defibrillator use, medications, doses and timing.   {Document cardiac monitor, telemetry assessment procedure when appropriate:1}  Medications Ordered in ED Medications - No data to display  ED Course/ Medical Decision Making/ A&P   {   Click here for ABCD2, HEART and other calculatorsREFRESH Note before signing :1}                              Medical Decision Making  *** 1906  {Document critical care time when appropriate:1} {Document review of labs and clinical decision tools ie heart score, Chads2Vasc2 etc:1}  {Document your independent review of radiology images, and any outside records:1} {Document your discussion with family members, caretakers, and with consultants:1} {Document social determinants of health affecting pt's care:1} {Document your decision making why or why not admission, treatments were needed:1} Final Clinical Impression(s) / ED Diagnoses Final diagnoses:  None    Rx / DC Orders ED Discharge Orders     None

## 2023-04-11 DEATH — deceased
